# Patient Record
Sex: Female | Born: 1960 | Race: White | Hispanic: No | Marital: Single | Smoking: Never smoker
Health system: Southern US, Community
[De-identification: ages and names within clinical notes are randomized; demographics above are authoritative.]

## PROBLEM LIST (undated history)

## (undated) DIAGNOSIS — B029 Zoster without complications: Secondary | ICD-10-CM

## (undated) DIAGNOSIS — E079 Disorder of thyroid, unspecified: Secondary | ICD-10-CM

## (undated) DIAGNOSIS — I4891 Unspecified atrial fibrillation: Secondary | ICD-10-CM

## (undated) DIAGNOSIS — I517 Cardiomegaly: Secondary | ICD-10-CM

## (undated) DIAGNOSIS — Q8789 Other specified congenital malformation syndromes, not elsewhere classified: Secondary | ICD-10-CM

## (undated) HISTORY — PX: MINIMALLY INVASIVE TRICUSPID VALVE REPAIR: SHX5975

## (undated) HISTORY — PX: CORONARY ARTERY BYPASS GRAFT: SHX141

## (undated) HISTORY — PX: MITRAL VALVE REPAIR: SHX2039

## (undated) HISTORY — PX: MAZE: SHX5063

## (undated) HISTORY — PX: PACEMAKER INSERTION: SHX728

## (undated) HISTORY — PX: AORTIC VALVE REPLACEMENT: SHX41

---
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
## 2007-11-22 HISTORY — PX: ABDOMINAL HYSTERECTOMY: SHX81

## 2011-07-23 ENCOUNTER — Emergency Department (HOSPITAL_COMMUNITY): Payer: Medicaid Other

## 2011-07-23 ENCOUNTER — Inpatient Hospital Stay (HOSPITAL_COMMUNITY)
Admission: EM | Admit: 2011-07-23 | Discharge: 2011-07-29 | DRG: 310 | Disposition: A | Discharge status: Home / Self Care | Payer: Medicaid Other | Attending: Internal Medicine | Admitting: Internal Medicine

## 2011-07-23 DIAGNOSIS — F411 Generalized anxiety disorder: Secondary | ICD-10-CM | POA: Diagnosis present

## 2011-07-23 DIAGNOSIS — R040 Epistaxis: Secondary | ICD-10-CM | POA: Diagnosis not present

## 2011-07-23 DIAGNOSIS — I4891 Unspecified atrial fibrillation: Principal | ICD-10-CM | POA: Diagnosis present

## 2011-07-23 DIAGNOSIS — I059 Rheumatic mitral valve disease, unspecified: Secondary | ICD-10-CM | POA: Diagnosis present

## 2011-07-23 DIAGNOSIS — IMO0002 Reserved for concepts with insufficient information to code with codable children: Secondary | ICD-10-CM | POA: Diagnosis present

## 2011-07-23 DIAGNOSIS — M75100 Unspecified rotator cuff tear or rupture of unspecified shoulder, not specified as traumatic: Secondary | ICD-10-CM | POA: Diagnosis present

## 2011-07-23 DIAGNOSIS — I079 Rheumatic tricuspid valve disease, unspecified: Secondary | ICD-10-CM | POA: Diagnosis present

## 2011-07-23 DIAGNOSIS — Z79899 Other long term (current) drug therapy: Secondary | ICD-10-CM

## 2011-07-23 DIAGNOSIS — E876 Hypokalemia: Secondary | ICD-10-CM | POA: Diagnosis present

## 2011-07-23 DIAGNOSIS — R791 Abnormal coagulation profile: Secondary | ICD-10-CM | POA: Diagnosis present

## 2011-07-23 DIAGNOSIS — R0609 Other forms of dyspnea: Secondary | ICD-10-CM | POA: Diagnosis not present

## 2011-07-23 DIAGNOSIS — R0989 Other specified symptoms and signs involving the circulatory and respiratory systems: Secondary | ICD-10-CM | POA: Diagnosis not present

## 2011-07-23 DIAGNOSIS — G4733 Obstructive sleep apnea (adult) (pediatric): Secondary | ICD-10-CM | POA: Diagnosis present

## 2011-07-23 DIAGNOSIS — Z79890 Hormone replacement therapy (postmenopausal): Secondary | ICD-10-CM

## 2011-07-23 DIAGNOSIS — Z23 Encounter for immunization: Secondary | ICD-10-CM

## 2011-07-23 DIAGNOSIS — Z9079 Acquired absence of other genital organ(s): Secondary | ICD-10-CM

## 2011-07-23 LAB — URINALYSIS, ROUTINE W REFLEX MICROSCOPIC
Glucose, UA: NEGATIVE mg/dL
Hgb urine dipstick: NEGATIVE
Nitrite: NEGATIVE
Protein, ur: 30 mg/dL — AB
Specific Gravity, Urine: 1.034 — ABNORMAL HIGH (ref 1.005–1.030)
Urobilinogen, UA: 1 mg/dL (ref 0.0–1.0)
pH: 5.5 (ref 5.0–8.0)

## 2011-07-23 LAB — URINE MICROSCOPIC-ADD ON

## 2011-07-23 LAB — CBC
HCT: 39.1 % (ref 36.0–46.0)
Hemoglobin: 12.9 g/dL (ref 12.0–15.0)
MCH: 30.6 pg (ref 26.0–34.0)
MCHC: 33 g/dL (ref 30.0–36.0)
MCV: 92.7 fL (ref 78.0–100.0)
Platelets: 276 K/uL (ref 150–400)
RBC: 4.22 MIL/uL (ref 3.87–5.11)
RDW: 13.9 % (ref 11.5–15.5)
WBC: 7.4 K/uL (ref 4.0–10.5)

## 2011-07-23 LAB — BASIC METABOLIC PANEL WITH GFR
CO2: 22 meq/L (ref 19–32)
Chloride: 104 meq/L (ref 96–112)
Creatinine, Ser: 1.07 mg/dL (ref 0.50–1.10)
GFR calc Af Amer: >60 mL/min (ref >60)
Potassium: 3.2 meq/L — ABNORMAL LOW (ref 3.5–5.1)

## 2011-07-23 LAB — DIFFERENTIAL
Basophils Absolute: 0 K/uL (ref 0.0–0.1)
Basophils Relative: 0 % (ref 0–1)
Eosinophils Absolute: 0 K/uL (ref 0.0–0.7)
Eosinophils Relative: 1 % (ref 0–5)
Lymphocytes Relative: 29 % (ref 12–46)
Lymphs Abs: 2.1 K/uL (ref 0.7–4.0)
Monocytes Absolute: 0.5 10*3/uL (ref 0.1–1.0)
Monocytes Relative: 6 % (ref 3–12)
Neutro Abs: 4.7 10*3/uL (ref 1.7–7.7)
Neutrophils Relative %: 64 % (ref 43–77)

## 2011-07-23 LAB — POCT I-STAT, CHEM 8
BUN: 14 mg/dL (ref 6–23)
Calcium, Ion: 1.15 mmol/L (ref 1.12–1.32)
Chloride: 108 mEq/L (ref 96–112)
Creatinine, Ser: 1 mg/dL (ref 0.50–1.10)
Glucose, Bld: 123 mg/dL — ABNORMAL HIGH (ref 70–99)
HCT: 41 % (ref 36.0–46.0)
Hemoglobin: 13.9 g/dL (ref 12.0–15.0)
Potassium: 3.3 meq/L — ABNORMAL LOW (ref 3.5–5.1)
Sodium: 141 meq/L (ref 135–145)
TCO2: 19 mmol/L (ref 0–100)

## 2011-07-23 LAB — BASIC METABOLIC PANEL
BUN: 14 mg/dL (ref 6–23)
Calcium: 9.1 mg/dL (ref 8.4–10.5)
GFR calc non Af Amer: 55 mL/min — ABNORMAL LOW (ref >60)
Glucose, Bld: 122 mg/dL — ABNORMAL HIGH (ref 70–99)
Sodium: 139 mEq/L (ref 135–145)

## 2011-07-23 LAB — PROTIME-INR
INR: 0.99 (ref 0.00–1.49)
Prothrombin Time: 13.3 s (ref 11.6–15.2)

## 2011-07-23 LAB — APTT: aPTT: 27 s (ref 24–37)

## 2011-07-23 LAB — POCT I-STAT TROPONIN I: Troponin i, poc: 0 ng/mL (ref 0.00–0.08)

## 2011-07-23 LAB — D-DIMER, QUANTITATIVE: D-Dimer, Quant: 0.55 ug{FEU}/mL — ABNORMAL HIGH (ref 0.00–0.48)

## 2011-07-24 LAB — CARDIAC PANEL(CRET KIN+CKTOT+MB+TROPI)
CK, MB: 1.9 ng/mL (ref 0.3–4.0)
CK, MB: 1.7 ng/mL (ref 0.3–4.0)
Relative Index: INVALID (ref 0.0–2.5)
Total CK: 93 U/L (ref 7–177)
Total CK: 72 U/L (ref 7–177)
Troponin I: <0.3 ng/mL (ref <0.30)

## 2011-07-24 LAB — COMPREHENSIVE METABOLIC PANEL
ALT: 43 U/L — ABNORMAL HIGH (ref 0–35)
CO2: 24 mEq/L (ref 19–32)
Calcium: 8.2 mg/dL — ABNORMAL LOW (ref 8.4–10.5)
GFR calc Af Amer: >60 mL/min (ref >60)
GFR calc non Af Amer: >60 mL/min (ref >60)
Glucose, Bld: 100 mg/dL — ABNORMAL HIGH (ref 70–99)
Sodium: 138 mEq/L (ref 135–145)

## 2011-07-24 LAB — CBC
MCH: 30.9 pg (ref 26.0–34.0)
MCHC: 33.2 g/dL (ref 30.0–36.0)
Platelets: 200 10*3/uL (ref 150–400)

## 2011-07-24 LAB — PHOSPHORUS: Phosphorus: 2.4 mg/dL (ref 2.3–4.6)

## 2011-07-25 ENCOUNTER — Inpatient Hospital Stay (HOSPITAL_COMMUNITY): Payer: Medicaid Other

## 2011-07-25 LAB — BASIC METABOLIC PANEL
BUN: 11 mg/dL (ref 6–23)
Creatinine, Ser: 0.77 mg/dL (ref 0.50–1.10)
GFR calc Af Amer: >60 mL/min (ref >60)
GFR calc non Af Amer: >60 mL/min (ref >60)
Potassium: 4.1 mEq/L (ref 3.5–5.1)

## 2011-07-25 LAB — DIFFERENTIAL
Basophils Absolute: 0 10*3/uL (ref 0.0–0.1)
Eosinophils Absolute: 0.1 10*3/uL (ref 0.0–0.7)
Eosinophils Relative: 2 % (ref 0–5)
Lymphocytes Relative: 36 % (ref 12–46)
Monocytes Absolute: 0.3 10*3/uL (ref 0.1–1.0)

## 2011-07-25 LAB — CBC
HCT: 33.6 % — ABNORMAL LOW (ref 36.0–46.0)
MCHC: 32.1 g/dL (ref 30.0–36.0)
MCV: 93.6 fL (ref 78.0–100.0)
RDW: 13.9 % (ref 11.5–15.5)

## 2011-07-25 NOTE — H&P (Signed)
[name], Newton NO.:  [id number]  MEDICAL RECORD NO.:  [id number]  LOCATION:  WLED                         FACILITY:  [facility]  PHYSICIAN:  Marinda Elk, M.D.DATE OF BIRTH:  [birth year]  DATE OF ADMISSION:  07/23/2011 DATE OF DISCHARGE:                             HISTORY & PHYSICAL   CHIEF COMPLAINT:  Shoulder pain.  HISTORY OF PRESENT ILLNESS:  This is a 50 year old with past medical history of AFib and also Marfan-like features, and family history of Marfan syndrome that comes in for severe shoulder pain.  She relates that shoulder pain started intermittently about a month ago, but she just let a ride to see how it goes.  She relates one night before prior to admission, she started getting so bad.  She was not able to tolerate. She got up, took Motrin, and went back to bed, but then slept out throughout the night.  She went to work in the morning of admission and her pain continued to get worse.  She relates some mild shortness of breath with this shoulder pain, but she continued working to the point until 12 noon where she could not tolerate it, so she decided to go home.  She relates that the pain does not radiate.  Ice makes it better. Pressure makes it better.  Worse with breathing, but nothing makes __________.  She is up-to-date on her mammograms and Pap smears.  She does not relate any fever, chills, any nausea or vomiting.  Has not traveled recently.  No recent surgeries.  Up-to-date on her mammograms and Pap smears.  ALLERGIES:  ASPIRIN.  PAST MEDICAL HISTORY:  AFib.  MEDICATIONS: 1. Stadol 2 mg p.o. daily. 2. Caltrate D 1 tablet daily. 3. Allegra p.r.n. as needed. 4. Motrin 800 mg p.o. t.i.d.  SOCIAL HISTORY:  She denies alcohol, tobacco, or drugs.  FAMILY HISTORY:  Her father died of MI at 97.  Her mother is alive, is 43 years old, has diabetes, coronary artery disease, is a cancer survivor.  She has a full sister that has  Marfan syndrome.  She is 45. She is alive.  She has an older half sister that has Marfan syndrome and others were alive.  REVIEW OF SYSTEMS:  Ten point review of systems done, pertinent positives per HPI.  PHYSICAL EXAMINATION:  VITAL SIGNS:  Temperature 97, pulse of 150 initially when she came into the emergency room and now has been ranging 120 to 130.  Blood pressure 136/90.  She was saturating at 100% on room air breathing 18 times per minute. GENERAL:  She is awake, alert and oriented x3, in mild distress. HEENT:  Dry mucous membrane.  No JVD.  No bruit.  No thyromegaly. Anicteric.  No pallor. CARDIOVASCULAR:  She has irregular rate and rhythm, tachycardic with a positive S1 and S2. LUNGS:  Good air movement.  Clear to auscultation. ABDOMEN:  Positive bowel sounds, nontender, nondistended. EXTREMITIES:  Positive pulses.  No clubbing, cyanosis, or edema.  Her right shoulder is not red, tender, or warm.  Has mild pain reproducible by palpation, but not significant. NEURO:  Nonfocal.  LABORATORY DATA ON ADMISSION:  PTT of 27,  PT of 13, INR of __________. D-dimer was 0.55.  Her sodium was 139, potassium 3.2, chloride 104, bicarbonate of 22, glucose of 122, BUN of 14, creatinine of 1.0, calcium 9.1.  Her white count of 7.4 with an ANC of 4.7, hemoglobin of 12.9 with MCV of 92, platelet count 276.  Her first set of point of cardiac care markers negative x1.  IMAGING DATA ON ADMISSION:  Chest x-ray shows upper limits of normal size without evidence of acute cardiopulmonary disease.  X-ray of the shoulder is unremarkable.  CT angio of the chest showed no evidence of PE.  EKG shows atrial fibrillation at 175 when it was done.  No ST- segment depression.  ASSESSMENT/PLAN: 1. Atrial fibrillation with rapid ventricular response in a patient     with known history of atrial fibrillation.  We will admit to the     step-down unit and titrate her diltiazem.  We will cycle her     cardiac  enzymes.  We will check UA, UDS, TSH, B12, and alcohol     level.  We will call Cardiology in the morning.  At this time, a CT     scan was done to rule out a pulmonary embolism due to her high D-     dimer, but it was negative.  At this time, we will follow up on her     electrolytes to replete her potassium.  Check a magnesium level and     replete as needed.  We will try to control heart rate to keep it     below 110. 2. Shoulder pain.  Imaging was done with no significant fracture or     dislocation.  At this time, we will get CMET to evaluate her for     hepatobiliary disease.  This radiates to the shoulder.  We will get     pain control. 3. Hypokalemia.  We will replete and check a magnesium.     Marinda Elk, M.D.     AF/MEDQ  D:  07/23/2011  T:  07/23/2011  Job:  [id number]  cc:   Primary Care Doctor  Electronically Signed by Marinda Elk M.D. on 07/25/2011 07:01:04 AM

## 2011-07-26 LAB — CBC
HCT: 36.8 % (ref 36.0–46.0)
Hemoglobin: 11.9 g/dL — ABNORMAL LOW (ref 12.0–15.0)
MCH: 29.9 pg (ref 26.0–34.0)
MCV: 92.5 fL (ref 78.0–100.0)
RBC: 3.98 MIL/uL (ref 3.87–5.11)

## 2011-07-26 LAB — BASIC METABOLIC PANEL
CO2: 25 mEq/L (ref 19–32)
Calcium: 8.8 mg/dL (ref 8.4–10.5)
Creatinine, Ser: 0.76 mg/dL (ref 0.50–1.10)
Glucose, Bld: 90 mg/dL (ref 70–99)

## 2011-07-26 LAB — DIFFERENTIAL
Lymphs Abs: 1.5 10*3/uL (ref 0.7–4.0)
Monocytes Relative: 6 % (ref 3–12)
Neutro Abs: 4.5 10*3/uL (ref 1.7–7.7)
Neutrophils Relative %: 69 % (ref 43–77)

## 2011-07-26 LAB — PROTIME-INR: INR: 0.97 (ref 0.00–1.49)

## 2011-07-27 ENCOUNTER — Ambulatory Visit (HOSPITAL_COMMUNITY): Payer: Medicaid Other

## 2011-07-27 ENCOUNTER — Inpatient Hospital Stay (HOSPITAL_COMMUNITY): Payer: Medicaid Other

## 2011-07-27 LAB — BASIC METABOLIC PANEL
BUN: 15 mg/dL (ref 6–23)
CO2: 25 mEq/L (ref 19–32)
Chloride: 104 mEq/L (ref 96–112)
Creatinine, Ser: 0.91 mg/dL (ref 0.50–1.10)
GFR calc Af Amer: >60 mL/min (ref >60)
Glucose, Bld: 89 mg/dL (ref 70–99)

## 2011-07-27 LAB — CBC
HCT: 35.2 % — ABNORMAL LOW (ref 36.0–46.0)
MCV: 93.1 fL (ref 78.0–100.0)
RDW: 14.1 % (ref 11.5–15.5)
WBC: 4.4 10*3/uL (ref 4.0–10.5)

## 2011-07-27 LAB — DIFFERENTIAL
Eosinophils Relative: 2 % (ref 0–5)
Lymphocytes Relative: 35 % (ref 12–46)
Lymphs Abs: 1.5 10*3/uL (ref 0.7–4.0)

## 2011-07-27 MED ORDER — TECHNETIUM TC 99M TETROFOSMIN IV KIT
10.0000 | PACK | Freq: Once | INTRAVENOUS | Status: AC | PRN
  Administered 2011-07-27: 10 via INTRAVENOUS

## 2011-07-27 MED ORDER — TECHNETIUM TC 99M TETROFOSMIN IV KIT
30.0000 | PACK | Freq: Once | INTRAVENOUS | Status: AC | PRN
  Administered 2011-07-27: 30 via INTRAVENOUS

## 2011-07-28 LAB — PROTIME-INR
INR: 1.6 — ABNORMAL HIGH (ref 0.00–1.49)
Prothrombin Time: 19.3 seconds — ABNORMAL HIGH (ref 11.6–15.2)

## 2011-07-28 LAB — BLOOD GAS, ARTERIAL
Bicarbonate: 23.1 mEq/L (ref 20.0–24.0)
pCO2 arterial: 32.3 mmHg — ABNORMAL LOW (ref 35.0–45.0)
pH, Arterial: 7.468 — ABNORMAL HIGH (ref 7.350–7.400)
pO2, Arterial: 75.3 mmHg — ABNORMAL LOW (ref 80.0–100.0)

## 2011-07-29 LAB — PROTIME-INR: Prothrombin Time: 27.7 seconds — ABNORMAL HIGH (ref 11.6–15.2)

## 2011-07-30 NOTE — Discharge Summary (Signed)
Brittany Newton, Brittany Newton NO.:  [id number]  MEDICAL RECORD NO.:  [id number]  LOCATION:  1223                         FACILITY:  [facility]  PHYSICIAN:  Andreas Blower, MD       DATE OF BIRTH:  12-06-1960  DATE OF ADMISSION:  07/23/2011 DATE OF DISCHARGE:  07/29/2011                              DISCHARGE SUMMARY   PRIMARY CARE PHYSICIAN:  The patient does not have one at this time, is in the process of contemplating Dr. Algie Coffer to be her primary care physician.  CARDIOLOGIST:  Ricki Rodriguez, MD  DISCHARGE DIAGNOSES: 1. Atrial fibrillation with rapid ventricular response, rate     controlled, started on anticoagulation. 2. Right subdeltoid bursitis. 3. Moderate mitral regurgitation and moderate tricuspid regurgitation. 4. Epistaxis from trauma, resolved. 5. Suspected obstructive sleep apnea, we will arrange for outpatient     sleep study. 6. History of anxiety.  DISCHARGE MEDICATIONS: 1. Amiodarone 200 mg p.o. twice daily for 1 week, then once daily     thereafter. 2. Diltiazem 240 mg ER p.o. daily. 3. Hydrocodone/APAP 5/325 one tablet every 6 hours as needed for pain. 4. Metoprolol 100 mg p.o. twice daily. 5. Prednisone 40 mg p.o. daily for 1 day, then 20 mg p.o. daily for 2     days, then 10 mg p.o. daily for 2 days, and 5 mg p.o. daily for 2     days, and discontinue. 6. Senna 2 tablets daily as needed for constipation. 7. Warfarin 5 mg p.o. q.p.m. 8. Cal-Citrate D 600 mg p.o. twice daily. 9. Estradiol 2 mg p.o. daily at bedtime. 10.Fexofenadine 118 mg daily at bedtime. 11.Melatonin 3 mg p.o. daily at bedtime as needed for sleep. 12.Multivitamin 1 tablet p.o. daily.  BRIEF ADMITTING HISTORY AND PHYSICAL:  Brittany Newton is a 50 year old Caucasian female with history of AFib and also Marfan like features with family history of Marfan syndrome who presents with severe right shoulder pain.  RADIOLOGY/IMAGING:  The patient had a CT angiogram, which shows  no evidence for acute pulmonary embolism.  Enlarged left heart.  The patient had complete abdominal ultrasound, which showed 0.5 cm structure along the gallbladder wall.  Findings are most compatible with gallbladder polyp.  1.2 cm echoic structure in the spleen nonspecific.  The patient had an MRI of right shoulder without contrast, which showed subdeltoid bursitis.  Deformity of the distal clavicle could predispose to impingement.  The patient had myocardial perfusion scan, which shows no reversible or fixed defects.  The patient had a 2D echocardiogram on July 24, 2011, which showed left ventricle cavity size was mildly dilated.  There was mild concentric hypertrophy.  Systolic function was mildly reduced, ejection fraction was 45-50%.  Possible mild hypokinesis of the anterior septal myocardium.  Aortic valve showed mild regurgitation.  Mitral valve showed calcified annulus with moderate myxomatous degeneration.  Mild late systolic bowing without prolapse.  Moderate regurgitation. Tricuspid showed moderate regurgitation.  Pulmonary artery systolic pressure was mildly increased with a PA peak pressures of 36 mmHg.  LABORATORY DATA:  CBC shows a white count of 4.8, hemoglobin 11.3, hematocrit 35.2, platelet count 230, INR 2.53.  Electrolytes normal with  a BUN of 15, creatinine 0.91.  Troponins negative x3.  Urine drug screen positive for opiates.  Blood alcohol level less than 11.  Vitamin B12 of 219.  MRSA by PCR negative.  TSH 2.002.  HOSPITAL COURSE BY PROBLEM: 1. AFib with RVR.  The patient was admitted to step-down and was     started on IV diltiazem.  The patient's heart rate was not well-     controlled with IV diltiazem.  As a result, Dr. Algie Coffer with     Cardiology was consulted.  The patient was started on metoprolol     and amiodarone with good heart rate controlled.  Stress test was     also performed given the echo cardiogram findings.  Stress     myocardial  perfusion scan was negative.  The patient was also     started on Coumadin given her risk factors.  The patient to have     her PT/INR checked on August 01, 2011, in Dr. Roseanne Kaufman office. 2. Right shoulder pain.  The patient had MRI, which showed right     subdeltoid bursitis.  Dr. Thomasena Edis from [facility] was     consulted.  Previously, the patient has had a bad reaction to the     steroid injection with flu-like symptoms.  As a result, the patient     was started on steroid taper and the patient was also given     exercises for physical therapy.  The patient to continue exercises,     continue the taper for 1 more week.  If the patient continues to     have symptoms, the patient to follow up with Dr. Thomasena Edis as an     outpatient. 3. Moderate MR/TR, stable at this time.  Further management as per     Cardiology as outpatient. 4. Epistaxis from trauma, resolved with holding pressure. 5. Desaturation while asleep.  The patient's O2 saturations decreased     while asleep.  Suspect the patient may have obstructive sleep     apnea.  Will arrange for outpatient sleep study.  DISPOSITION AND FOLLOWUP:  The patient to follow up with Dr. Algie Coffer on August 01, 2011, both for an office visit as well as for a PT/INR. The patient is to follow with Dr. Thomasena Edis with [facility] in 3 weeks if the right shoulder pain is not improving.  Time spent on discharge, talking to the patient and coordinating care was 35 minutes.     Andreas Blower, MD     SR/MEDQ  D:  07/29/2011  T:  07/29/2011  Job:  [id number]  Electronically Signed by Wardell Heath Oliva Montecalvo  on 07/30/2011 08:44:37 PM

## 2011-08-17 ENCOUNTER — Encounter (HOSPITAL_BASED_OUTPATIENT_CLINIC_OR_DEPARTMENT_OTHER): Payer: Medicaid Other

## 2011-08-24 NOTE — Consult Note (Signed)
NAMEMCKINNLEY, COTTIER NO.:  [id number]  MEDICAL RECORD NO.:  [id number]  LOCATION:  1223                         FACILITY:  [facility]  PHYSICIAN:  Erasmo Leventhal, M.D.DATE OF BIRTH:  07-16-1961  DATE OF CONSULTATION:  07/26/2011 DATE OF DISCHARGE:                                CONSULTATION   HISTORY OF PRESENT ILLNESS:  Dr. Thomasena Edis of [facility] requested for evaluation of Ms. Knippel's right shoulder pain.  She was having severe shoulder pain.  She came to the emergency room. She was found to have some AFib, so she was admitted for a workup of AFib and continued right shoulder pain.  She states she is a single mom.  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  She denies any injury over the last several months. She has noted over the last month or so she has had some slow progression of the anterior shoulder pain at very point specific.  It would bother her very intermittently, sometimes it feels like a piece of tissue is rolling around in the shoulder or severe sharp pain.  She denies any numbness or tingling.  She would just try to avoid activities that seem to aggravate it.  She is using occasional Motrin.  She states she picked her young son up just the day before her admission when she felt this real sharp stabbing type pain across the front part of her shoulder with a piece tissue rolling around.  She tried to go to work that day.  She tolerated to about noontime, then she eventually came to the ER with severe shoulder pain when she was found to have significant atrial fib.  She does have a significant history of AFib in the past and Marfan's syndrome.  ALLERGIES:  ASPIRIN.  SOCIAL HISTORY:  Single mom.  She denies alcohol, drugs, or tobacco.  PHYSICAL EXAM:  GENERAL:  Patient is a healthy-appearing well-developed female in ICU bed.  She appears to be eating very comfortably without any  obvious distress. EXTREMITIES:  She does not have any obvious splinting type motion of her right upper extremity.  She has got good sensation in the right upper extremities with good radial pulse, good motion of the wrist and elbow. She is able to have strong biceps, triceps strength.  She is able raise her arm over her head today without any discomfort.  She is very tender with palpation over the bicipital groove and palpating the biceps tendon where it courses into the anterior part of the acromion.  She has no AC joint discomfort.  She does not have any weakness of her rotator cuff structures.  She does have some slight circumferential abduction discomfort.  She has got good motion of the neck.  Review of x-rays, 3 views taken in the hospital system were unremarkable for any obvious fractures.  Review of the MRI shows she has bicipital tendonitis with subacromial bursitis.  Rotator cuff structures were intact.  IMPRESSION:  Symptomatic right shoulder bicipital tendonitis with subacromial bursitis.  DISPOSITION:  At this time, after viewing patient's findings and physical exam, I reviewed the  issues with the patient about her shoulder.  We discussed treatments with a cortisone injection but she states she has had cortisone injection in the past in her feet and they have made her real ill-appearing, feeling like she has got the flu.  She would like to avoid this.  She has had prednisone in the past for anti- inflammatory issues and she says she has tolerated that well.  She is currently having a workup for atrial fib.  She is going to have a [place] test done tomorrow.  She is n.p.o. at this point at midnight. We did discuss with Dr. Algie Coffer about her shoulder and the possibility of prednisone Dosepak, he feels that it would be okay from his standpoint, so we will go ahead and start that tomorrow after she has her procedure done, otherwise if we start it tonight, she may not  get very good sleep.  In the meantime, we will just use some Norco for discomfort.  She is going to use warm compresses as she finds cold compresses really aggravate her shoulder. After a course of quieting her shoulder down, we will try some physical therapy.  Patient had no other questions.  We will continue to follow her up while she is here in the hospital.     Jamelle Rushing, P.A.   ______________________________ Erasmo Leventhal, M.D.    RWK/MEDQ  D:  07/26/2011  T:  07/26/2011  Job:  [id number]  cc:   Erasmo Leventhal, M.D. Fax: [phone]  Electronically Signed by Arlyn Leak P.A. on 08/05/2011 12:03:19 PM Electronically Signed by Eugenia Mcalpine M.D. on 08/24/2011 11:37:14 AM

## 2011-09-08 ENCOUNTER — Ambulatory Visit (HOSPITAL_BASED_OUTPATIENT_CLINIC_OR_DEPARTMENT_OTHER): Payer: Medicaid Other | Attending: Internal Medicine

## 2011-09-08 DIAGNOSIS — G4710 Hypersomnia, unspecified: Secondary | ICD-10-CM | POA: Insufficient documentation

## 2011-09-08 DIAGNOSIS — Z79899 Other long term (current) drug therapy: Secondary | ICD-10-CM | POA: Insufficient documentation

## 2011-09-08 DIAGNOSIS — I4891 Unspecified atrial fibrillation: Secondary | ICD-10-CM | POA: Insufficient documentation

## 2011-09-08 DIAGNOSIS — G4730 Sleep apnea, unspecified: Secondary | ICD-10-CM | POA: Insufficient documentation

## 2011-09-17 DIAGNOSIS — R0989 Other specified symptoms and signs involving the circulatory and respiratory systems: Secondary | ICD-10-CM

## 2011-09-17 DIAGNOSIS — G4710 Hypersomnia, unspecified: Secondary | ICD-10-CM

## 2011-09-17 DIAGNOSIS — R0609 Other forms of dyspnea: Secondary | ICD-10-CM

## 2011-09-17 DIAGNOSIS — G4730 Sleep apnea, unspecified: Secondary | ICD-10-CM

## 2011-09-17 NOTE — Procedures (Signed)
NAMEBILAN, Brittany Newton                 ACCOUNT NO.:  [id number]  MEDICAL RECORD NO.:  [id number]          PATIENT TYPE:  OUT  LOCATION:  SLEEP CENTER                 FACILITY:  [facility]  PHYSICIAN:  Eilan Mcinerny D. Maple Hudson, MD, FCCP, FACPDATE OF BIRTH:  June 22, 1961  DATE OF STUDY:  09/08/2011                           NOCTURNAL POLYSOMNOGRAM  REFERRING PHYSICIAN:  Andreas Blower, MD  REFERRING PHYSICIAN:  Andreas Blower, MD.  INDICATION FOR STUDY:  Hypersomnia with sleep apnea.  EPWORTH SLEEPINESS SCORE:  Epworth sleepiness score; 2/24.  BMI 27.7, weight 210 pounds, height 73 inches, neck 14 inches.  MEDICATIONS:  Home medications charted and reviewed.  SLEEP ARCHITECTURE:  Total sleep time 340.5 minutes with sleep efficiency 88.1%.  Stage I was 8.5%, stage II 64.5%, stage III 7.8%, REM 19.2% of total sleep time.  Sleep latency 10 minutes.  REM latency 78.5 minutes.  Awake after sleep onset 36.5 minutes.  Arousal index 15.7.  BEDTIME MEDICATION:  Estradiol, Caltrate, warfarin, amiodarone, metoprolol, alprazolam.  RESPIRATORY DATA:  Apnea-hypopnea index (AHI) 1.8 per hour.  A total of 10 events was scored, all as hypopneas and mostly associated with supine sleep and REM.  REM AHI 2.7 per hour.  There were insufficient numbers of events to qualify for CPAP titration protocol on the study night.  OXYGEN DATA:  Moderate snoring with oxygen desaturation to a nadir of 89% and a mean oxygen saturation through the study of 93% on room air.  CARDIAC DATA:  Atrial fibrillation with ventricular response, rate 68-76 per minute through most of the study.  MOVEMENT-PARASOMNIA:  No significant movement disturbance.  No bathroom trips.  IMPRESSIONS-RECOMMENDATIONS: 1. Occasional respiratory event with sleep disturbance, within normal     limits.  AHI 1.8 per hour (the normal range for adults is from 0-5     events per hour).  Moderate snoring with oxygen desaturation to a     nadir of 89% and a mean  oxygen saturation through the study of 93%     on room air.  No time was recorded with saturation less than     88% on room air. 2. Atrial fibrillation with controlled ventricular response rate.     Slayde Brault D. Maple Hudson, MD, [facility], FACP Diplomate, Biomedical engineer of Sleep Medicine Electronically Signed    CDY/MEDQ  D:  09/17/2011 09:34:59  T:  09/17/2011 09:58:38  Job:  [id number]

## 2012-05-16 ENCOUNTER — Inpatient Hospital Stay (HOSPITAL_COMMUNITY)
Admission: AD | Admit: 2012-05-16 | Discharge: 2012-05-21 | DRG: 202 | Disposition: A | Discharge status: Home / Self Care | Payer: Medicaid Other | Source: Ambulatory Visit | Attending: Cardiovascular Disease | Admitting: Cardiovascular Disease

## 2012-05-16 ENCOUNTER — Encounter (HOSPITAL_COMMUNITY): Payer: Self-pay | Admitting: *Deleted

## 2012-05-16 DIAGNOSIS — Z6829 Body mass index (BMI) 29.0-29.9, adult: Secondary | ICD-10-CM

## 2012-05-16 DIAGNOSIS — Q8740 Marfan's syndrome, unspecified: Secondary | ICD-10-CM

## 2012-05-16 DIAGNOSIS — Z7901 Long term (current) use of anticoagulants: Secondary | ICD-10-CM

## 2012-05-16 DIAGNOSIS — I4891 Unspecified atrial fibrillation: Secondary | ICD-10-CM | POA: Diagnosis present

## 2012-05-16 DIAGNOSIS — J40 Bronchitis, not specified as acute or chronic: Principal | ICD-10-CM | POA: Diagnosis present

## 2012-05-16 HISTORY — DX: Cardiomegaly: I51.7

## 2012-05-16 LAB — CBC
HCT: 37.6 % (ref 36.0–46.0)
MCH: 31.7 pg (ref 26.0–34.0)
MCHC: 33.2 g/dL (ref 30.0–36.0)
MCV: 95.4 fL (ref 78.0–100.0)
RDW: 14.2 % (ref 11.5–15.5)

## 2012-05-16 LAB — DIFFERENTIAL
Basophils Absolute: 0 10*3/uL (ref 0.0–0.1)
Basophils Relative: 0 % (ref 0–1)
Eosinophils Relative: 1 % (ref 0–5)
Monocytes Absolute: 0.7 10*3/uL (ref 0.1–1.0)

## 2012-05-16 LAB — BASIC METABOLIC PANEL
BUN: 18 mg/dL (ref 6–23)
CO2: 26 mEq/L (ref 19–32)
Chloride: 100 mEq/L (ref 96–112)
Creatinine, Ser: 1.15 mg/dL — ABNORMAL HIGH (ref 0.50–1.10)

## 2012-05-16 LAB — PROTIME-INR: Prothrombin Time: 20.5 seconds — ABNORMAL HIGH (ref 11.6–15.2)

## 2012-05-16 MED ORDER — FUROSEMIDE 40 MG PO TABS
40.0000 mg | ORAL_TABLET | Freq: Every day | ORAL | Status: DC
  Administered 2012-05-17 – 2012-05-21 (×5): 40 mg via ORAL
  Filled 2012-05-16 (×6): qty 1

## 2012-05-16 MED ORDER — PREDNISONE 20 MG PO TABS
20.0000 mg | ORAL_TABLET | Freq: Every day | ORAL | Status: DC
  Administered 2012-05-16 – 2012-05-21 (×6): 20 mg via ORAL
  Filled 2012-05-16 (×7): qty 1

## 2012-05-16 MED ORDER — SODIUM CHLORIDE 0.9 % IJ SOLN
3.0000 mL | Freq: Two times a day (BID) | INTRAMUSCULAR | Status: DC
  Administered 2012-05-16 – 2012-05-21 (×10): 3 mL via INTRAVENOUS

## 2012-05-16 MED ORDER — AMIODARONE HCL 200 MG PO TABS
200.0000 mg | ORAL_TABLET | Freq: Two times a day (BID) | ORAL | Status: DC
  Administered 2012-05-16 – 2012-05-21 (×9): 200 mg via ORAL
  Filled 2012-05-16 (×14): qty 1

## 2012-05-16 MED ORDER — HYDROCODONE-ACETAMINOPHEN 5-325 MG PO TABS
1.0000 | ORAL_TABLET | ORAL | Status: DC | PRN
  Administered 2012-05-18: 2 via ORAL
  Administered 2012-05-18 – 2012-05-21 (×3): 1 via ORAL
  Filled 2012-05-16 (×2): qty 1
  Filled 2012-05-16: qty 2
  Filled 2012-05-16: qty 1

## 2012-05-16 MED ORDER — POTASSIUM CHLORIDE CRYS ER 10 MEQ PO TBCR
10.0000 meq | EXTENDED_RELEASE_TABLET | Freq: Every day | ORAL | Status: DC
  Administered 2012-05-16 – 2012-05-21 (×6): 10 meq via ORAL
  Filled 2012-05-16 (×8): qty 1

## 2012-05-16 MED ORDER — LORATADINE 10 MG PO TABS
10.0000 mg | ORAL_TABLET | Freq: Every day | ORAL | Status: DC
  Administered 2012-05-17 – 2012-05-21 (×5): 10 mg via ORAL
  Filled 2012-05-16 (×7): qty 1

## 2012-05-16 MED ORDER — DEXTROSE 5 % IV SOLN
500.0000 mg | INTRAVENOUS | Status: DC
  Administered 2012-05-16 – 2012-05-18 (×3): 500 mg via INTRAVENOUS
  Filled 2012-05-16 (×3): qty 500

## 2012-05-16 MED ORDER — WARFARIN - PHYSICIAN DOSING INPATIENT: Freq: Every day | Status: DC

## 2012-05-16 MED ORDER — ALPRAZOLAM 0.25 MG PO TABS
0.2500 mg | ORAL_TABLET | Freq: Two times a day (BID) | ORAL | Status: DC | PRN
  Administered 2012-05-17 – 2012-05-20 (×3): 0.25 mg via ORAL
  Filled 2012-05-16 (×3): qty 1

## 2012-05-16 MED ORDER — DEXTROSE 5 % IV SOLN
1.0000 g | INTRAVENOUS | Status: DC
  Administered 2012-05-16 – 2012-05-20 (×5): 1 g via INTRAVENOUS
  Filled 2012-05-16 (×7): qty 10

## 2012-05-16 MED ORDER — LEVALBUTEROL TARTRATE 45 MCG/ACT IN AERO
2.0000 | INHALATION_SPRAY | RESPIRATORY_TRACT | Status: DC
  Administered 2012-05-16 – 2012-05-17 (×3): 2 via RESPIRATORY_TRACT
  Filled 2012-05-16: qty 15

## 2012-05-16 MED ORDER — ESTRADIOL 2 MG PO TABS
2.0000 mg | ORAL_TABLET | Freq: Every day | ORAL | Status: DC
  Administered 2012-05-16 – 2012-05-20 (×5): 2 mg via ORAL
  Filled 2012-05-16 (×7): qty 1

## 2012-05-16 MED ORDER — GUAIFENESIN 100 MG/5ML PO SOLN
5.0000 mL | ORAL | Status: DC | PRN
  Filled 2012-05-16: qty 5

## 2012-05-16 MED ORDER — WARFARIN SODIUM 3 MG PO TABS
3.0000 mg | ORAL_TABLET | ORAL | Status: DC
  Administered 2012-05-16: 3 mg via ORAL
  Filled 2012-05-16: qty 1

## 2012-05-16 MED ORDER — METOPROLOL TARTRATE 100 MG PO TABS
100.0000 mg | ORAL_TABLET | Freq: Two times a day (BID) | ORAL | Status: DC
  Administered 2012-05-16 – 2012-05-21 (×10): 100 mg via ORAL
  Filled 2012-05-16 (×13): qty 1

## 2012-05-16 NOTE — H&P (Signed)
Brittany Newton is an 51 y.o. female.   Chief Complaint: Cough and shortness of breath HPI: 51 years old white female with cough and cold x 3 weeks not improving with one round of antibiotic.  PMH: No DM< II No HTN, No smoking, A. Fibrillation, Marfan's syndrome.    No past surgical history on file.  Family history: Her father died of MI at 55. Her mother is alive, is 1 years old, has diabetes, coronary artery disease, is a cancer survivor. She has a full sister that has Marfan syndrome. She is 45. She is alive. She has an older half sister that has Marfan syndrome and others were alive.  Social History:  does not have a smoking history. Her alcohol and drug histories not on file.  Allergies: Aspirin  No prescriptions prior to admission  Amiodarone 200 mg, Warfarin 3 mg, Metoprolol 100 mg. Bid, xanax, estradiol 2 mg. Lasix, Allegra-D, KCl  No results found for this or any previous visit (from the past 48 hour(s)). No results found.  @ROS @ No weight gain, + chest pain, + asthma, No gi bleed, No blood transfusion, no hepatitis, No kidney stone, No CVA, seizures or psychiatric admissions  Physical Exam P-94, R-18, BP-114/77, T-98.2, O2 sat-97 % GENERAL: She is awake, alert and oriented x3, in mild respiratory distress.  HEENT: PERl, EOMI, Conj- Pink, Sclera-no icterus. Throat-red, no exudate.  Neck: No JVD. No bruit. No thyromegaly.  CARDIOVASCULAR: She has irregular rate and rhythm, with a  positive S1 and S2.  LUNGS: Good air movement. Expiratory wheeze to auscultation.  ABDOMEN: Positive bowel sounds, nontender, nondistended.  EXTREMITIES: Positive pulses. No clubbing, cyanosis, or edema.  NEURO: Nonfocal.   Assessment/Plan Bronchitis Chronic coumadin use A. Fibrillation  Home medications, Antibiotic, Prednisone  Brittany Newton S 05/16/2012, 12:57 PM

## 2012-05-16 NOTE — Progress Notes (Signed)
Pt being admitted with bronchitis from Dr. Roseanne Kaufman office. Had bronchitis for 3 weeks. 10 days of antibiotics and steroids still not better.

## 2012-05-17 MED ORDER — WARFARIN SODIUM 3 MG PO TABS
3.0000 mg | ORAL_TABLET | Freq: Once | ORAL | Status: AC
  Administered 2012-05-17: 3 mg via ORAL
  Filled 2012-05-17: qty 1

## 2012-05-17 MED ORDER — DIGOXIN 250 MCG PO TABS
0.2500 mg | ORAL_TABLET | Freq: Every day | ORAL | Status: AC
  Administered 2012-05-17 – 2012-05-20 (×4): 0.25 mg via ORAL
  Filled 2012-05-17 (×6): qty 1

## 2012-05-17 MED ORDER — WARFARIN SODIUM 3 MG PO TABS
3.0000 mg | ORAL_TABLET | Freq: Every day | ORAL | Status: DC
  Administered 2012-05-17 – 2012-05-20 (×4): 3 mg via ORAL
  Filled 2012-05-17 (×7): qty 1

## 2012-05-17 MED ORDER — LEVALBUTEROL TARTRATE 45 MCG/ACT IN AERO
2.0000 | INHALATION_SPRAY | Freq: Two times a day (BID) | RESPIRATORY_TRACT | Status: DC
  Administered 2012-05-17 – 2012-05-21 (×9): 2 via RESPIRATORY_TRACT

## 2012-05-17 NOTE — Progress Notes (Signed)
Subjective:  Still coughing. No fever.  Objective:  Vital Signs in the last 24 hours: Temp:  [97.7 F (36.5 C)-98.2 F (36.8 C)] 97.7 F (36.5 C) (06/27 0500) Pulse Rate:  [83-98] 83  (06/27 0500) Cardiac Rhythm:  [-] Atrial fibrillation (06/26 2028) Resp:  [18] 18  (06/27 0500) BP: (96-114)/(65-77) 96/67 mmHg (06/27 0500) SpO2:  [93 %-98 %] 98 % (06/27 0810) Weight:  [105.9 kg (233 lb 7.5 oz)] 105.9 kg (233 lb 7.5 oz) (06/26 1400)  Physical Exam: BP Readings from Last 1 Encounters:  05/17/12 96/67    Wt Readings from Last 1 Encounters:  05/16/12 105.9 kg (233 lb 7.5 oz)    Weight change:   HEENT: [place]/AT, Eyes-Brown, PERL, EOMI, Conjunctiva-Pink, Sclera-Non-icteric Neck: No JVD, No bruit, Trachea midline. Lungs:  Harsh, Bilateral. Cardiac:  Regular rhythm, normal S1 and S2, no S3.  Abdomen:  Soft, non-tender. Extremities:  No edema present. No cyanosis. No clubbing. CNS: AxOx3, Cranial nerves grossly intact, moves all 4 extremities. Right handed. Skin: Warm and dry.   Intake/Output from previous day: 06/26 0701 - 06/27 0700 In: 3 [I.V.:3] Out: -     Lab Results: BMET    Component Value Date/Time   NA 136 05/16/2012 1315   K 3.7 05/16/2012 1315   CL 100 05/16/2012 1315   CO2 26 05/16/2012 1315   GLUCOSE 104* 05/16/2012 1315   BUN 18 05/16/2012 1315   CREATININE 1.15* 05/16/2012 1315   CALCIUM 8.8 05/16/2012 1315   GFRNONAA 55* 05/16/2012 1315   GFRAA 64* 05/16/2012 1315   CBC    Component Value Date/Time   WBC 11.1* 05/16/2012 1315   RBC 3.94 05/16/2012 1315   HGB 12.5 05/16/2012 1315   HCT 37.6 05/16/2012 1315   PLT 291 05/16/2012 1315   MCV 95.4 05/16/2012 1315   MCH 31.7 05/16/2012 1315   MCHC 33.2 05/16/2012 1315   RDW 14.2 05/16/2012 1315   LYMPHSABS 0.8 05/16/2012 1315   MONOABS 0.7 05/16/2012 1315   EOSABS 0.1 05/16/2012 1315   BASOSABS 0.0 05/16/2012 1315   CARDIAC ENZYMES Lab Results  Component Value Date   CKTOTAL 72 07/24/2011   CKMB 1.7 07/24/2011   TROPONINI <0.30 07/24/2011    Assessment/Plan:  Patient Active Hospital Problem List: Bronchitis Chronic coumadin use- sub-therapeutic INR. A. Fibrillation Marfan's syndrome  Continue antibiotic Increase coumadin Ice cubes/lozenges as needed   LOS: 1 day    Orpah Cobb  MD  05/17/2012, 8:38 AM

## 2012-05-18 ENCOUNTER — Inpatient Hospital Stay (HOSPITAL_COMMUNITY): Payer: Medicaid Other

## 2012-05-18 LAB — PROTIME-INR: INR: 1.78 — ABNORMAL HIGH (ref 0.00–1.49)

## 2012-05-18 MED ORDER — AZITHROMYCIN 250 MG PO TABS
250.0000 mg | ORAL_TABLET | Freq: Once | ORAL | Status: AC
  Administered 2012-05-18: 250 mg via ORAL
  Filled 2012-05-18: qty 1

## 2012-05-18 MED ORDER — AZITHROMYCIN 500 MG PO TABS
500.0000 mg | ORAL_TABLET | Freq: Every day | ORAL | Status: DC
Start: 2012-05-19 — End: 2012-05-21
  Administered 2012-05-19 – 2012-05-21 (×3): 500 mg via ORAL
  Filled 2012-05-18 (×4): qty 1

## 2012-05-18 NOTE — Progress Notes (Signed)
Subjective:  Afebrile. + Cough. X-ray suggestive of resolving pneumonia or atelectasis.  Objective:  Vital Signs in the last 24 hours: Temp:  [97.5 F (36.4 C)-98 F (36.7 C)] 98 F (36.7 C) (06/28 1400) Pulse Rate:  [74-107] 87  (06/28 1400) Cardiac Rhythm:  [-] Atrial flutter (06/27 2157) Resp:  [20] 20  (06/28 1400) BP: (98-108)/(69-71) 98/70 mmHg (06/28 1400) SpO2:  [96 %-99 %] 97 % (06/28 1400)  Physical Exam: BP Readings from Last 1 Encounters:  05/18/12 98/70    Wt Readings from Last 1 Encounters:  05/16/12 105.9 kg (233 lb 7.5 oz)    Weight change:   HEENT: [place]/AT, Eyes-Blue, PERL, EOMI, Conjunctiva-Pink, Sclera-Non-icteric Neck: No JVD, No bruit, Trachea midline. Lungs:  Forced expiratory wheeze, Bilateral. Cardiac:  Regular rhythm, normal S1 and S2, no S3.  Abdomen:  Soft, non-tender. Extremities:  No edema present. No cyanosis. No clubbing. CNS: AxOx3, Cranial nerves grossly intact, moves all 4 extremities. Right handed. Skin: Warm and dry.   Intake/Output from previous day: 06/27 0701 - 06/28 0700 In: 123 [P.O.:120; I.V.:3] Out: -     Lab Results: BMET    Component Value Date/Time   NA 136 05/16/2012 1315   K 3.7 05/16/2012 1315   CL 100 05/16/2012 1315   CO2 26 05/16/2012 1315   GLUCOSE 104* 05/16/2012 1315   BUN 18 05/16/2012 1315   CREATININE 1.15* 05/16/2012 1315   CALCIUM 8.8 05/16/2012 1315   GFRNONAA 55* 05/16/2012 1315   GFRAA 64* 05/16/2012 1315   CBC    Component Value Date/Time   WBC 11.1* 05/16/2012 1315   RBC 3.94 05/16/2012 1315   HGB 12.5 05/16/2012 1315   HCT 37.6 05/16/2012 1315   PLT 291 05/16/2012 1315   MCV 95.4 05/16/2012 1315   MCH 31.7 05/16/2012 1315   MCHC 33.2 05/16/2012 1315   RDW 14.2 05/16/2012 1315   LYMPHSABS 0.8 05/16/2012 1315   MONOABS 0.7 05/16/2012 1315   EOSABS 0.1 05/16/2012 1315   BASOSABS 0.0 05/16/2012 1315   CARDIAC ENZYMES Lab Results  Component Value Date   CKTOTAL 72 07/24/2011   CKMB 1.7 07/24/2011   TROPONINI  <0.30 07/24/2011    Assessment/Plan:  Patient Active Hospital Problem List:  Bronchitis  Chronic coumadin use- sub-therapeutic INR.  A. Fibrillation  Marfan's syndrome  CXR/Continue medications.    LOS: 2 days    Orpah Cobb  MD  05/18/2012, 6:32 PM

## 2012-05-19 LAB — PROTIME-INR
INR: 2.21 — ABNORMAL HIGH (ref 0.00–1.49)
Prothrombin Time: 24.9 seconds — ABNORMAL HIGH (ref 11.6–15.2)

## 2012-05-19 MED ORDER — GUAIFENESIN ER 600 MG PO TB12
600.0000 mg | ORAL_TABLET | Freq: Two times a day (BID) | ORAL | Status: DC | PRN
  Filled 2012-05-19: qty 1

## 2012-05-19 NOTE — Progress Notes (Signed)
Subjective:  Patient complains of cough with mild shortness of breath. Denies chest pain or hemoptysis  Objective:  Vital Signs in the last 24 hours: Temp:  [97.4 F (36.3 C)-98 F (36.7 C)] 97.9 F (36.6 C) (06/29 0500) Pulse Rate:  [78-91] 81  (06/29 1146) Resp:  [18-20] 18  (06/29 0500) BP: (92-98)/(56-70) 92/56 mmHg (06/29 1009) SpO2:  [97 %] 97 % (06/29 0500) Weight:  [106.7 kg (235 lb 3.7 oz)] 106.7 kg (235 lb 3.7 oz) (06/29 0500)  Intake/Output from previous day:   Intake/Output from this shift:    Physical Exam: Neck: no adenopathy, no carotid bruit, no JVD and supple, symmetrical, trachea midline Lungs: Decreased breath sound at bases with left basilar rhonchi Heart: irregularly irregular rhythm, S1, S2 normal and Soft systolic murmur noted Abdomen: soft, non-tender; bowel sounds normal; no masses,  no organomegaly Extremities: extremities normal, atraumatic, no cyanosis or edema  Lab Results:  Basename 05/16/12 1315  WBC 11.1*  HGB 12.5  PLT 291    Basename 05/16/12 1315  NA 136  K 3.7  CL 100  CO2 26  GLUCOSE 104*  BUN 18  CREATININE 1.15*   No results found for this basename: TROPONINI:2,CK,MB:2 in the last 72 hours Hepatic Function Panel No results found for this basename: PROT,ALBUMIN,AST,ALT,ALKPHOS,BILITOT,BILIDIR,IBILI in the last 72 hours No results found for this basename: CHOL in the last 72 hours No results found for this basename: PROTIME in the last 72 hours  Imaging: Imaging results have been reviewed and Dg Chest 2 View  05/18/2012  *RADIOLOGY REPORT*  Clinical Data: Cough and congestion.  Possible bronchitis.  CHEST - 2 VIEW  Comparison: Chest x-ray 07/23/2011.  Findings: There is a linear opacity in the region of the left lower lobe, favored to represent an area of subsegmental atelectasis. Mild blunting of the left costophrenic sulcus suggests a small left- sided pleural effusion.  Lungs are otherwise clear.  Pulmonary vasculature is  normal.  Heart size is mildly enlarged (unchanged). Mediastinal contours are unremarkable.  IMPRESSION: 1.  Linear opacity in the left lower lobe is favored to represent an area of subsegmental atelectasis.  Given the patient's history of recent infectious symptoms, this could conceivably represent an area of resolving pneumonia.  There is also a small left-sided pleural effusion.  Repeat radiographs in 2-3 weeks after possible trial of antimicrobial therapy are recommended to ensure complete resolution of this finding (i.e., to exclude a centrally obstructing lesion).  Original Report Authenticated By: Florencia Reasons, M.D.    Cardiac Studies:  Assessment/Plan:  Resolving left lower lobe pneumonia Atrial fibrillation Hypertension Morbid obesity History of Marfan syndrome Plan Continue present management   LOS: 3 days    Valory Wetherby N 05/19/2012, 12:58 PM

## 2012-05-20 LAB — PROTIME-INR
INR: 2.29 — ABNORMAL HIGH (ref 0.00–1.49)
Prothrombin Time: 25.6 seconds — ABNORMAL HIGH (ref 11.6–15.2)

## 2012-05-20 NOTE — Progress Notes (Signed)
Subjective:  States feels better than yesterday cough has improved  Objective:  Vital Signs in the last 24 hours: Temp:  [97.5 F (36.4 C)-98 F (36.7 C)] 97.5 F (36.4 C) (06/30 0500) Pulse Rate:  [75-91] 86  (06/30 1024) Resp:  [18-20] 18  (06/30 0500) BP: (100-128)/(65-79) 128/76 mmHg (06/30 1025) SpO2:  [95 %-97 %] 96 % (06/30 0840) Weight:  [106.4 kg (234 lb 9.1 oz)] 106.4 kg (234 lb 9.1 oz) (06/30 0500)  Intake/Output from previous day:   Intake/Output from this shift:    Physical Exam: Neck: no adenopathy, no carotid bruit, no JVD and supple, symmetrical, trachea midline Lungs: Decreased breath sound at bases with occasional rhonchi at left base Heart: irregularly irregular rhythm, S1, S2 normal and Soft systolic murmur Abdomen: soft, non-tender; bowel sounds normal; no masses,  no organomegaly Extremities: extremities normal, atraumatic, no cyanosis or edema  Lab Results: No results found for this basename: WBC:2,HGB:2,PLT:2 in the last 72 hours No results found for this basename: NA:2,K:2,CL:2,CO2:2,GLUCOSE:2,BUN:2,CREATININE:2 in the last 72 hours No results found for this basename: TROPONINI:2,CK,MB:2 in the last 72 hours Hepatic Function Panel No results found for this basename: PROT,ALBUMIN,AST,ALT,ALKPHOS,BILITOT,BILIDIR,IBILI in the last 72 hours No results found for this basename: CHOL in the last 72 hours No results found for this basename: PROTIME in the last 72 hours  Imaging: Imaging results have been reviewed and Dg Chest 2 View  05/18/2012  *RADIOLOGY REPORT*  Clinical Data: Cough and congestion.  Possible bronchitis.  CHEST - 2 VIEW  Comparison: Chest x-ray 07/23/2011.  Findings: There is a linear opacity in the region of the left lower lobe, favored to represent an area of subsegmental atelectasis. Mild blunting of the left costophrenic sulcus suggests a small left- sided pleural effusion.  Lungs are otherwise clear.  Pulmonary vasculature is normal.   Heart size is mildly enlarged (unchanged). Mediastinal contours are unremarkable.  IMPRESSION: 1.  Linear opacity in the left lower lobe is favored to represent an area of subsegmental atelectasis.  Given the patient's history of recent infectious symptoms, this could conceivably represent an area of resolving pneumonia.  There is also a small left-sided pleural effusion.  Repeat radiographs in 2-3 weeks after possible trial of antimicrobial therapy are recommended to ensure complete resolution of this finding (i.e., to exclude a centrally obstructing lesion).  Original Report Authenticated By: Florencia Reasons, M.D.    Cardiac Studies:  Assessment/Plan:  Resolving left lower lobe pneumonia  Atrial fibrillation  Hypertension  Morbid obesity  History of Marfan syndrome  Plan  Continue present management Check chest x-ray in a.m.  LOS: 4 days    Brittany Newton N 05/20/2012, 11:38 AM

## 2012-05-21 ENCOUNTER — Inpatient Hospital Stay (HOSPITAL_COMMUNITY): Payer: Medicaid Other

## 2012-05-21 LAB — PROTIME-INR: Prothrombin Time: 27.1 seconds — ABNORMAL HIGH (ref 11.6–15.2)

## 2012-05-21 MED ORDER — WARFARIN SODIUM 3 MG PO TABS: 3.0000 mg | ORAL_TABLET | ORAL | Status: DC

## 2012-05-21 NOTE — Progress Notes (Signed)
Pt discharged to home with family. Received discharge instructions with no further questions. Will make follow up appointment with Dr Algie Coffer in a week and will continue prednisone taper at home. Stable for DC. IV DC'd. Duwaine Maxin, RN

## 2012-05-21 NOTE — Discharge Summary (Signed)
Physician Discharge Summary  Patient ID: Brittany Newton MRN: [id number] DOB/AGE: 23-Jan-1961 51 y.o.  Admit date: 05/16/2012 Discharge date: 05/21/2012  Admission Diagnoses: Bronchitis  Chronic coumadin use- sub-therapeutic INR.  A. Fibrillation  Marfan's syndrome  Discharge Diagnoses:  Principle Problems:  *Bronchitis* Chronic coumadin use.  A. Fibrillation  Marfan's syndrome Anxiety  Discharged Condition: good  Hospital Course: 51 years old white female with 3 weeks history of cough, shortness of breath in spite of antibiotics. She responded well to IV rocephin and oral zithromax. Her INR improved with increasing dose of coumadin and she was discharged home in satisfactory condition.  Consults: None  Significant Diagnostic Studies: labs: Normal electrolytes and CBC except slightly high WBC count of 11.1.  CXR- 05/18/2012: 1. Linear opacity in the left lower lobe is favored to represent an area of subsegmental atelectasis. Given the patient's history of recent infectious symptoms, this could conceivably represent an area of resolving pneumonia. There is also a small left-sided pleural effusion. Repeat radiographs in 2-3 weeks after possible trial of antimicrobial therapy are recommended to ensure complete resolution of this finding (i.e., to exclude a centrally obstructing lesion).  CXR-05/21/2012: Resolved left lower lobe airspace disease and pleural fluid. Cardiomegaly, without acute disease.  Treatments: antibiotics: ceftriaxone and azithromycin  Discharge Exam: Blood pressure 108/74, pulse 72, temperature 98.3 F (36.8 C), temperature source Oral, resp. rate 18, height 6\' 2"  (1.88 m), weight 104.2 kg (229 lb 11.5 oz), SpO2 97.00%. HEENT: [place]/AT, Eyes-Blue, PERL, EOMI, Conjunctiva-Pink, Sclera-Non-icteric  Neck: No JVD, No bruit, Trachea midline.  Lungs: Clear, Bilateral.  Cardiac: Regular rhythm, normal S1 and S2, no S3.  Abdomen: Soft, non-tender.  Extremities: No edema present.  No cyanosis. No clubbing.  CNS: AxOx3, Cranial nerves grossly intact, moves all 4 extremities. Right handed.  Skin: Warm and dry.   Disposition: 01-Home or Self Care   Medication List  As of 05/21/2012  9:48 AM   TAKE these medications         ALPRAZolam 0.25 MG tablet   Commonly known as: XANAX   Take 0.25 mg by mouth 2 (two) times daily as needed. For anxiety.      amiodarone 200 MG tablet   Commonly known as: PACERONE   Take 200 mg by mouth 2 (two) times daily.      CALTRATE 600+D PO   Take 600 mg by mouth 2 (two) times daily.      estradiol 2 MG tablet   Commonly known as: ESTRACE   Take 2 mg by mouth at bedtime.      fexofenadine-pseudoephedrine 180-240 MG per 24 hr tablet   Commonly known as: ALLEGRA-D 24   Take 1 tablet by mouth daily.      furosemide 40 MG tablet   Commonly known as: LASIX   Take 40 mg by mouth daily.      Glucosamine HCl 1500 MG Tabs   Take 1,500 mg by mouth 2 (two) times daily.      metoprolol 100 MG tablet   Commonly known as: LOPRESSOR   Take 100 mg by mouth 2 (two) times daily.      PHILLIPS [facility]ON [facility]ALTH Caps   Take 1 capsule by mouth at bedtime.      potassium chloride SA 20 MEQ tablet   Commonly known as: K-DUR,KLOR-CON   Take 10 mEq by mouth daily.      warfarin 3 MG tablet   Commonly known as: COUMADIN   Take 1 tablet (3 mg total) by  mouth every other day.           Follow-up Information    Follow up with [facility] S, MD. Schedule an appointment as soon as possible for a visit in 1 week.   Contact information:   [address] [place] [address] [phone]          Signed: Ricki Rodriguez 05/21/2012, 9:48 AM

## 2012-05-27 ENCOUNTER — Other Ambulatory Visit: Payer: Self-pay | Admitting: Cardiology

## 2012-05-29 ENCOUNTER — Ambulatory Visit
Admission: RE | Admit: 2012-05-29 | Discharge: 2012-05-29 | Disposition: A | Discharge status: Home / Self Care | Payer: Medicaid Other | Source: Ambulatory Visit | Attending: Cardiovascular Disease | Admitting: Cardiovascular Disease

## 2012-05-29 ENCOUNTER — Other Ambulatory Visit: Payer: Self-pay | Admitting: Cardiovascular Disease

## 2012-05-29 DIAGNOSIS — R200 Anesthesia of skin: Secondary | ICD-10-CM

## 2012-06-11 ENCOUNTER — Other Ambulatory Visit: Payer: Self-pay | Admitting: Orthopedic Surgery

## 2012-06-11 DIAGNOSIS — M545 Low back pain: Secondary | ICD-10-CM

## 2012-06-15 ENCOUNTER — Ambulatory Visit
Admission: RE | Admit: 2012-06-15 | Discharge: 2012-06-15 | Disposition: A | Discharge status: Home / Self Care | Payer: Medicaid Other | Source: Ambulatory Visit | Attending: Orthopedic Surgery | Admitting: Orthopedic Surgery

## 2012-06-15 DIAGNOSIS — M545 Low back pain: Secondary | ICD-10-CM

## 2012-06-21 ENCOUNTER — Ambulatory Visit: Payer: Medicaid Other | Attending: Orthopedic Surgery | Admitting: Rehabilitation

## 2012-06-21 DIAGNOSIS — M2550 Pain in unspecified joint: Secondary | ICD-10-CM | POA: Insufficient documentation

## 2012-06-21 DIAGNOSIS — IMO0001 Reserved for inherently not codable concepts without codable children: Secondary | ICD-10-CM | POA: Insufficient documentation

## 2012-06-21 DIAGNOSIS — R293 Abnormal posture: Secondary | ICD-10-CM | POA: Insufficient documentation

## 2012-06-21 DIAGNOSIS — M2560 Stiffness of unspecified joint, not elsewhere classified: Secondary | ICD-10-CM | POA: Insufficient documentation

## 2012-07-04 ENCOUNTER — Ambulatory Visit: Payer: Medicaid Other | Admitting: Physical Therapy

## 2012-07-12 ENCOUNTER — Ambulatory Visit: Payer: Medicaid Other | Admitting: Rehabilitation

## 2012-07-19 ENCOUNTER — Encounter: Payer: Medicaid Other | Admitting: Rehabilitation

## 2012-07-31 ENCOUNTER — Ambulatory Visit: Payer: Medicaid Other | Attending: Orthopedic Surgery | Admitting: Rehabilitation

## 2012-07-31 DIAGNOSIS — R293 Abnormal posture: Secondary | ICD-10-CM | POA: Insufficient documentation

## 2012-07-31 DIAGNOSIS — IMO0001 Reserved for inherently not codable concepts without codable children: Secondary | ICD-10-CM | POA: Insufficient documentation

## 2012-07-31 DIAGNOSIS — M2560 Stiffness of unspecified joint, not elsewhere classified: Secondary | ICD-10-CM | POA: Insufficient documentation

## 2012-07-31 DIAGNOSIS — M2550 Pain in unspecified joint: Secondary | ICD-10-CM | POA: Insufficient documentation

## 2013-06-14 ENCOUNTER — Other Ambulatory Visit: Payer: Self-pay

## 2013-06-14 LAB — BASIC METABOLIC PANEL
Calcium, Total: 9.1 mg/dL (ref 8.5–10.1)
Chloride: 105 mmol/L (ref 98–107)
Co2: 29 mmol/L (ref 21–32)
Creatinine: 1.22 mg/dL (ref 0.60–1.30)
EGFR (African American): 60 — ABNORMAL LOW
EGFR (Non-African Amer.): 52 — ABNORMAL LOW
Glucose: 92 mg/dL (ref 65–99)
Potassium: 4.2 mmol/L (ref 3.5–5.1)
Sodium: 138 mmol/L (ref 136–145)

## 2013-06-14 LAB — HEPATIC FUNCTION PANEL A (ARMC)
Alkaline Phosphatase: 123 U/L (ref 50–136)
Bilirubin, Direct: 0.1 mg/dL (ref 0.00–0.20)
SGOT(AST): 22 U/L (ref 15–37)
SGPT (ALT): 27 U/L (ref 12–78)

## 2013-06-14 LAB — PROTIME-INR
INR: 1.8
Prothrombin Time: 20.9 secs — ABNORMAL HIGH (ref 11.5–14.7)

## 2013-06-14 LAB — CBC WITH DIFFERENTIAL/PLATELET
Basophil #: 0 10*3/uL (ref 0.0–0.1)
Eosinophil #: 0.1 10*3/uL (ref 0.0–0.7)
Eosinophil %: 1.5 %
HCT: 41.3 % (ref 35.0–47.0)
HGB: 13.6 g/dL (ref 12.0–16.0)
Lymphocyte #: 0.9 10*3/uL — ABNORMAL LOW (ref 1.0–3.6)
Lymphocyte %: 24.9 %
MCH: 30.8 pg (ref 26.0–34.0)
MCV: 93 fL (ref 80–100)
Monocyte #: 0.3 x10 3/mm (ref 0.2–0.9)
Neutrophil #: 2.4 10*3/uL (ref 1.4–6.5)
Neutrophil %: 64.5 %
Platelet: 242 10*3/uL (ref 150–440)
RBC: 4.43 10*6/uL (ref 3.80–5.20)
RDW: 14.2 % (ref 11.5–14.5)
WBC: 3.8 10*3/uL (ref 3.6–11.0)

## 2013-06-14 LAB — TSH: Thyroid Stimulating Horm: 1.49 u[IU]/mL

## 2013-06-14 LAB — LIPID PANEL: Ldl Cholesterol, Calc: 112 mg/dL — ABNORMAL HIGH (ref 0–100)

## 2013-07-31 IMAGING — CT CT ANGIO CHEST
1 of 2 series · 19 of 32 positions shown · IV contrast (APPLIED)
Comparison: Plain film 07/23/2011

CLINICAL DATA: Right upper chest pain, right shoulder pain.
Positive D-dimer.

CT ANGIOGRAPHY CHEST WITH CONTRAST
TECHNIQUE: Multidetector CT imaging of the chest was performed
using the standard protocol during bolus administration of
intravenous contrast.  Multiplanar CT image reconstructions
including MIPs were obtained to evaluate the vascular anatomy.
Contrast:  100 ml Omnipaque 300

[Series 11: thins for pacs · axial · 0.76mm/px · z∈[+1122,+1370]mm · 19 of 276 slices shown]
[im 14/276  lung]
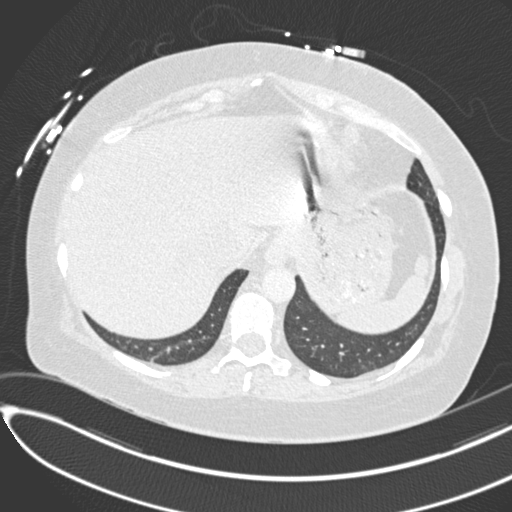
[im 28/276  mediastinal]
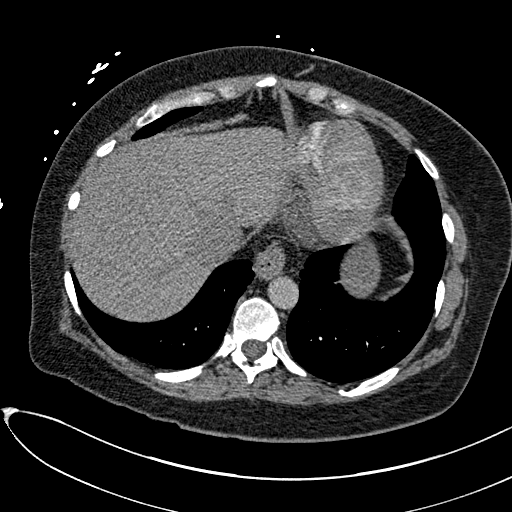
[im 42/276  lung]
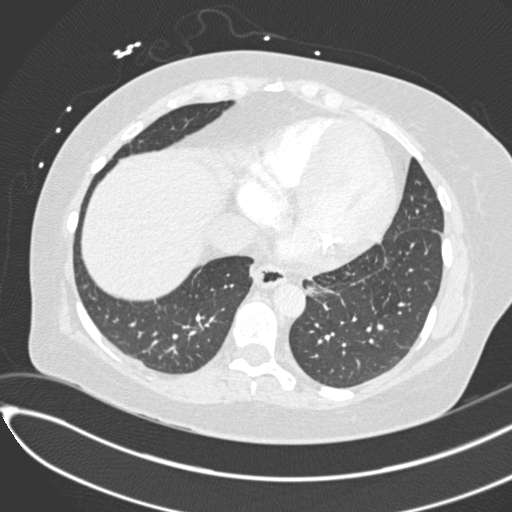
[im 69/276  mediastinal]
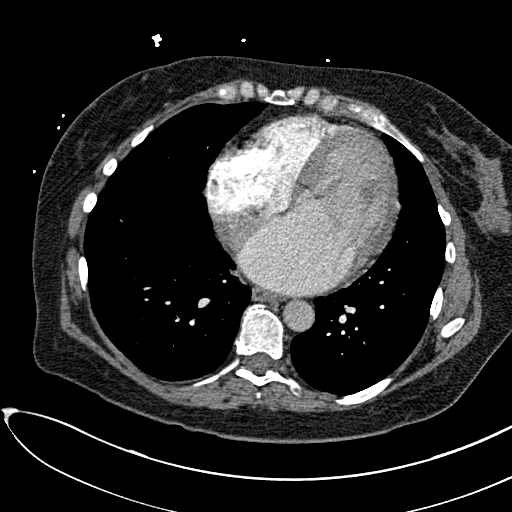
[im 83/276  lung]
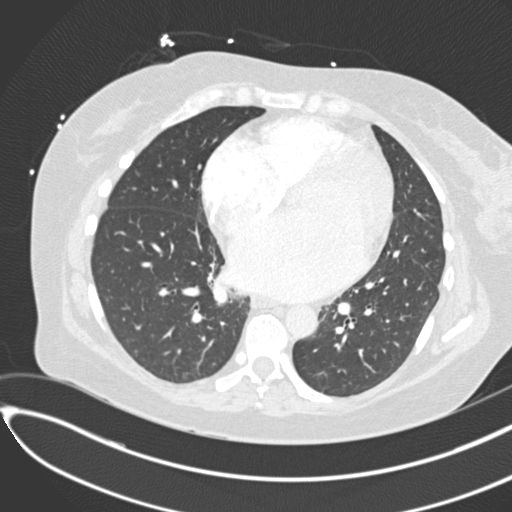
[im 92/276  mediastinal]
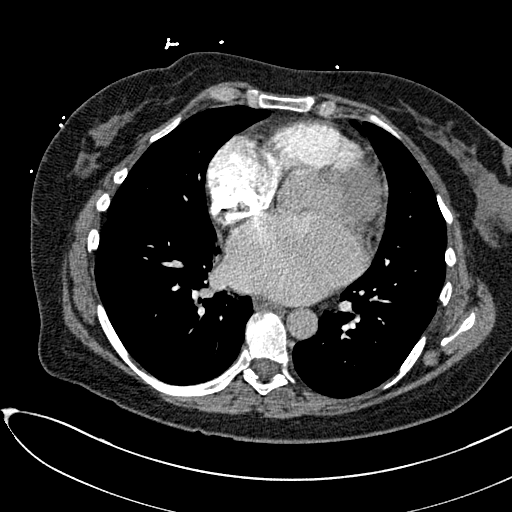
[im 97/276  lung]
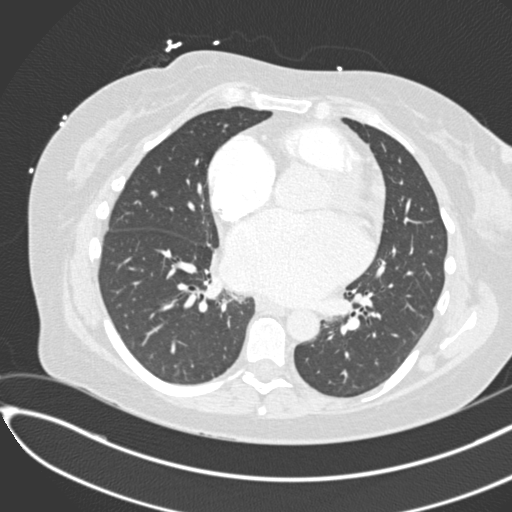
[im 111/276  mediastinal]
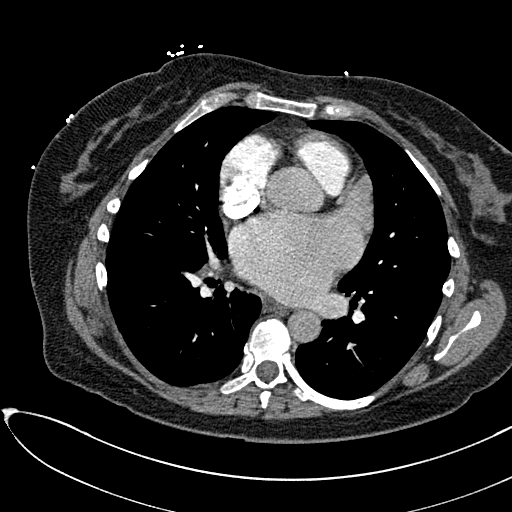
[im 124/276  lung]
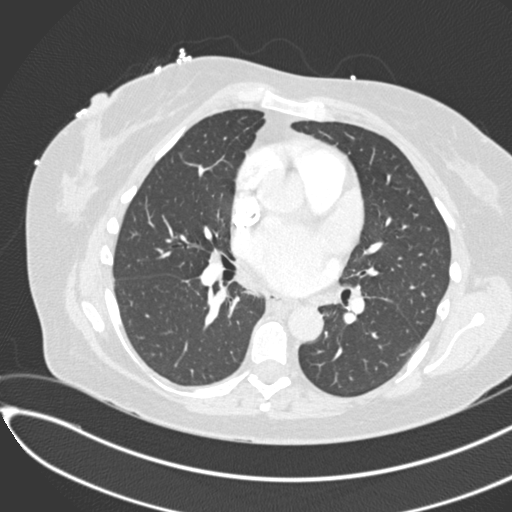
[im 138/276  mediastinal]
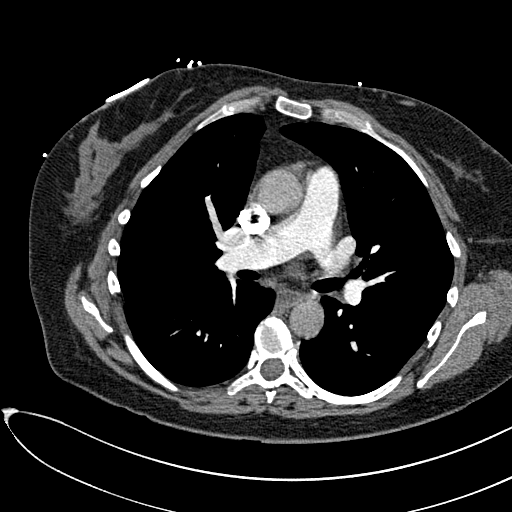
[im 152/276  lung]
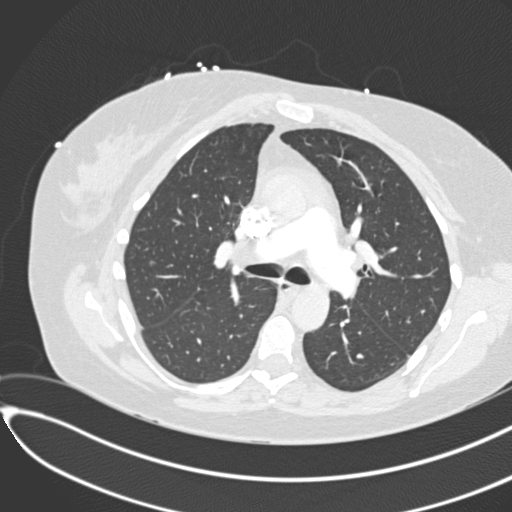
[im 166/276  mediastinal]
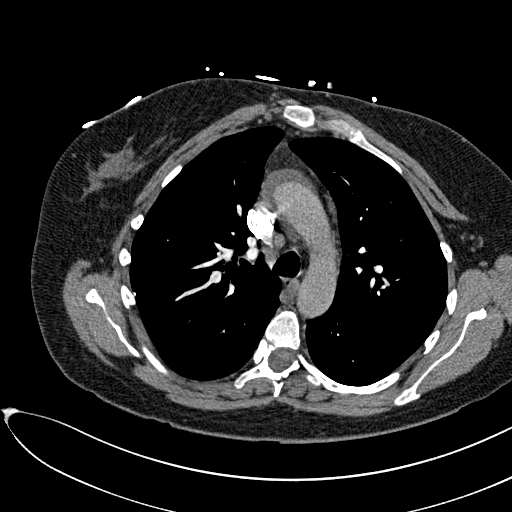
[im 179/276  lung]
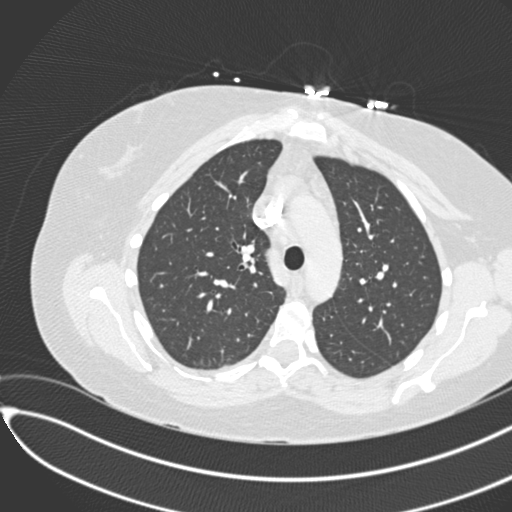
[im 184/276  mediastinal]
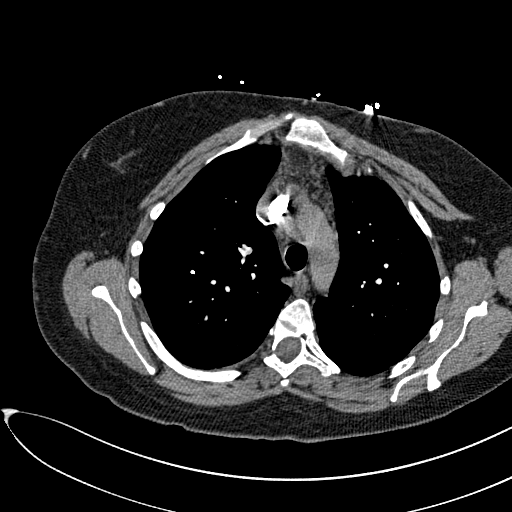
[im 193/276  lung]
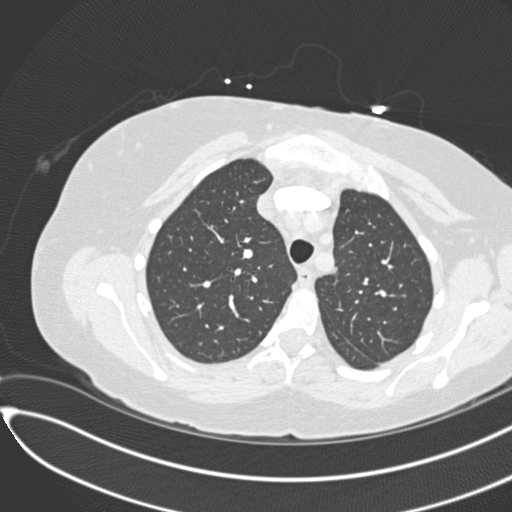
[im 207/276  mediastinal]
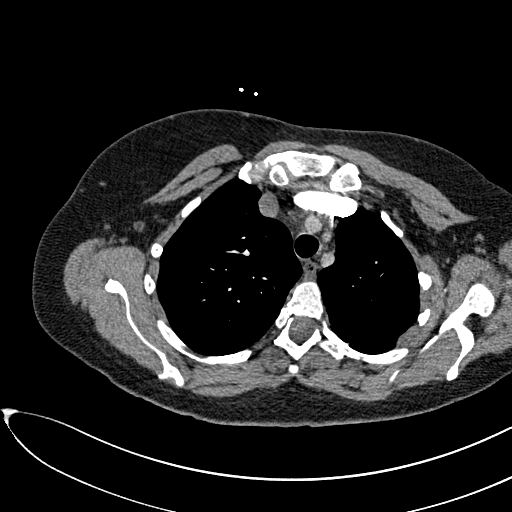
[im 234/276  lung]
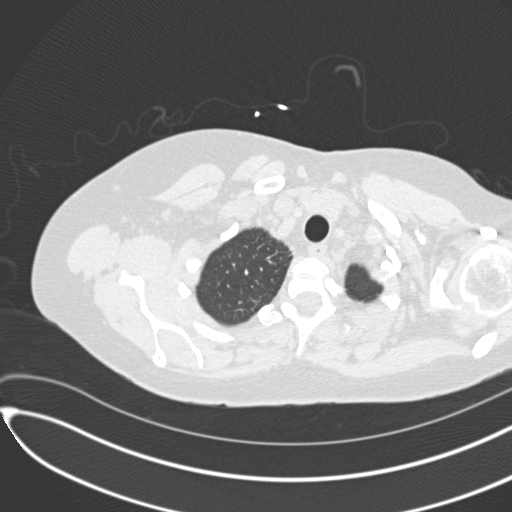
[im 248/276  mediastinal]
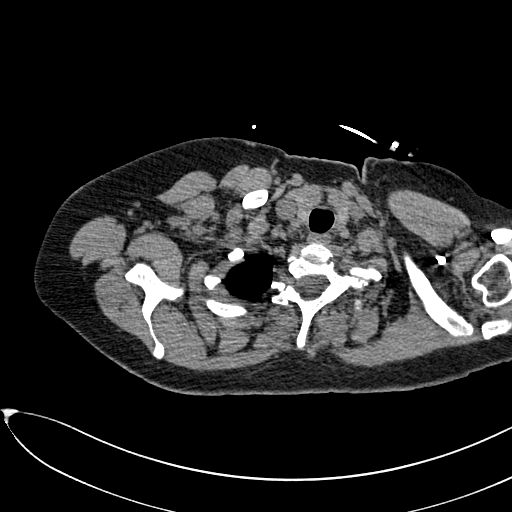
[im 262/276  lung]
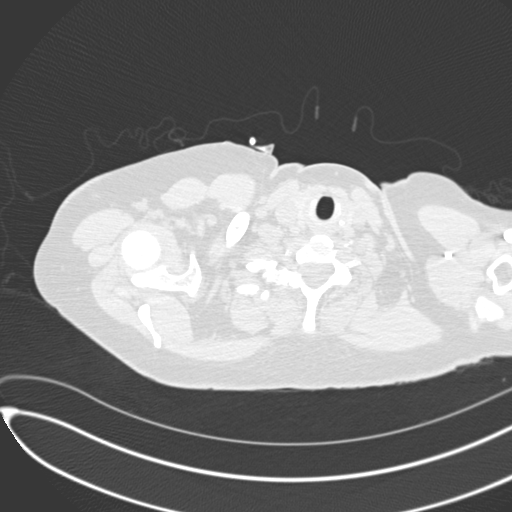

[19 of 32 positions shown; findings below may reference images not displayed]

FINDINGS: No axillary or supraclavicular lymphadenopathy.  There
are no filling defects within the pulmonary arteries to suggest
acute pulmonary embolism.  The no acute findings of the aorta or
great vessels.  No pericardial fluid.  The left atrium is enlarged.
The left ventricle is enlarged.

No mediastinal adenopathy.  Esophagus is normal.

Review lung parenchyma demonstrates no suspicious pulmonary
nodules.  No pleural fluid or consolidation.  No pneumothorax.
Limited view of the upper abdomen is unremarkable.

Limited view of the skeleton is unremarkable.

Review of the MIP images confirms the above findings.
IMPRESSION: 1.  No evidence of acute pulmonary embolism.
2.  Enlarged left heart.

## 2013-09-23 ENCOUNTER — Ambulatory Visit
Admission: RE | Admit: 2013-09-23 | Discharge: 2013-09-23 | Disposition: A | Discharge status: Home / Self Care | Payer: Medicaid Other | Source: Ambulatory Visit | Attending: Cardiovascular Disease | Admitting: Cardiovascular Disease

## 2013-09-23 ENCOUNTER — Inpatient Hospital Stay (HOSPITAL_COMMUNITY)
Admission: AD | Admit: 2013-09-23 | Discharge: 2013-09-25 | DRG: 202 | Disposition: A | Discharge status: Home / Self Care | Payer: Medicaid Other | Source: Ambulatory Visit | Attending: Cardiovascular Disease | Admitting: Cardiovascular Disease

## 2013-09-23 ENCOUNTER — Other Ambulatory Visit: Payer: Self-pay | Admitting: Cardiovascular Disease

## 2013-09-23 DIAGNOSIS — I4891 Unspecified atrial fibrillation: Secondary | ICD-10-CM | POA: Diagnosis present

## 2013-09-23 DIAGNOSIS — R05 Cough: Secondary | ICD-10-CM

## 2013-09-23 DIAGNOSIS — J45909 Unspecified asthma, uncomplicated: Principal | ICD-10-CM | POA: Diagnosis present

## 2013-09-23 DIAGNOSIS — Z23 Encounter for immunization: Secondary | ICD-10-CM

## 2013-09-23 DIAGNOSIS — Z833 Family history of diabetes mellitus: Secondary | ICD-10-CM

## 2013-09-23 DIAGNOSIS — E669 Obesity, unspecified: Secondary | ICD-10-CM | POA: Diagnosis present

## 2013-09-23 DIAGNOSIS — Z7901 Long term (current) use of anticoagulants: Secondary | ICD-10-CM

## 2013-09-23 DIAGNOSIS — Q8740 Marfan's syndrome, unspecified: Secondary | ICD-10-CM

## 2013-09-23 DIAGNOSIS — R7309 Other abnormal glucose: Secondary | ICD-10-CM | POA: Diagnosis present

## 2013-09-23 DIAGNOSIS — Z8249 Family history of ischemic heart disease and other diseases of the circulatory system: Secondary | ICD-10-CM

## 2013-09-23 LAB — CBC WITH DIFFERENTIAL/PLATELET
Basophils Absolute: 0 10*3/uL (ref 0.0–0.1)
Basophils Relative: 0 % (ref 0–1)
Eosinophils Absolute: 0.1 10*3/uL (ref 0.0–0.7)
Eosinophils Relative: 1 % (ref 0–5)
MCH: 31.5 pg (ref 26.0–34.0)
MCV: 94.6 fL (ref 78.0–100.0)
Neutrophils Relative %: 63 % (ref 43–77)
Platelets: 228 10*3/uL (ref 150–400)
RDW: 13.8 % (ref 11.5–15.5)
WBC: 5.6 10*3/uL (ref 4.0–10.5)

## 2013-09-23 LAB — COMPREHENSIVE METABOLIC PANEL
ALT: 18 U/L (ref 0–35)
CO2: 22 mEq/L (ref 19–32)
Calcium: 8.8 mg/dL (ref 8.4–10.5)
Creatinine, Ser: 1.14 mg/dL — ABNORMAL HIGH (ref 0.50–1.10)
GFR calc Af Amer: 64 mL/min — ABNORMAL LOW (ref >90)
GFR calc non Af Amer: 55 mL/min — ABNORMAL LOW (ref >90)
Glucose, Bld: 135 mg/dL — ABNORMAL HIGH (ref 70–99)
Sodium: 136 mEq/L (ref 135–145)
Total Protein: 6.7 g/dL (ref 6.0–8.3)

## 2013-09-23 LAB — PROTIME-INR
INR: 3.85 — ABNORMAL HIGH (ref 0.00–1.49)
Prothrombin Time: 36.4 seconds — ABNORMAL HIGH (ref 11.6–15.2)

## 2013-09-23 MED ORDER — HEPARIN SODIUM (PORCINE) 5000 UNIT/ML IJ SOLN: 5000.0000 [IU] | Freq: Three times a day (TID) | INTRAMUSCULAR | Status: DC

## 2013-09-23 MED ORDER — ACETAMINOPHEN 325 MG PO TABS
650.0000 mg | ORAL_TABLET | Freq: Four times a day (QID) | ORAL | Status: DC | PRN
  Administered 2013-09-23 – 2013-09-25 (×2): 650 mg via ORAL
  Filled 2013-09-23 (×2): qty 2

## 2013-09-23 MED ORDER — SODIUM CHLORIDE 0.9 % IV SOLN
INTRAVENOUS | Status: DC
  Administered 2013-09-23 – 2013-09-24 (×2): via INTRAVENOUS

## 2013-09-23 MED ORDER — ACETAMINOPHEN 650 MG RE SUPP: 650.0000 mg | Freq: Four times a day (QID) | RECTAL | Status: DC | PRN

## 2013-09-23 MED ORDER — ALPRAZOLAM 0.25 MG PO TABS
0.2500 mg | ORAL_TABLET | Freq: Every evening | ORAL | Status: DC | PRN
  Administered 2013-09-23 – 2013-09-24 (×2): 0.25 mg via ORAL
  Filled 2013-09-23 (×2): qty 1

## 2013-09-23 MED ORDER — LORATADINE 10 MG PO TABS
10.0000 mg | ORAL_TABLET | Freq: Every day | ORAL | Status: DC
  Administered 2013-09-23 – 2013-09-25 (×3): 10 mg via ORAL
  Filled 2013-09-23 (×3): qty 1

## 2013-09-23 MED ORDER — SODIUM CHLORIDE 0.9 % IJ SOLN
3.0000 mL | Freq: Two times a day (BID) | INTRAMUSCULAR | Status: DC
  Administered 2013-09-24: 3 mL via INTRAVENOUS

## 2013-09-23 MED ORDER — METHYLPREDNISOLONE SODIUM SUCC 125 MG IJ SOLR
125.0000 mg | Freq: Once | INTRAMUSCULAR | Status: AC
  Administered 2013-09-23: 125 mg via INTRAVENOUS
  Filled 2013-09-23: qty 2

## 2013-09-23 MED ORDER — ESTRADIOL 2 MG PO TABS
2.0000 mg | ORAL_TABLET | Freq: Every evening | ORAL | Status: DC
  Administered 2013-09-23 – 2013-09-24 (×2): 2 mg via ORAL
  Filled 2013-09-23 (×3): qty 1

## 2013-09-23 MED ORDER — WARFARIN SODIUM 3 MG PO TABS
3.0000 mg | ORAL_TABLET | Freq: Every evening | ORAL | Status: DC
  Filled 2013-09-23: qty 1

## 2013-09-23 MED ORDER — WARFARIN - PHYSICIAN DOSING INPATIENT: Freq: Every day | Status: DC

## 2013-09-23 MED ORDER — IPRATROPIUM BROMIDE 0.02 % IN SOLN
0.5000 mg | Freq: Four times a day (QID) | RESPIRATORY_TRACT | Status: DC
  Administered 2013-09-23 – 2013-09-24 (×2): 0.5 mg via RESPIRATORY_TRACT
  Filled 2013-09-23 (×3): qty 2.5

## 2013-09-23 MED ORDER — AMIODARONE HCL 200 MG PO TABS
200.0000 mg | ORAL_TABLET | Freq: Every morning | ORAL | Status: DC
  Administered 2013-09-24 – 2013-09-25 (×2): 200 mg via ORAL
  Filled 2013-09-23 (×2): qty 1

## 2013-09-23 MED ORDER — LEVALBUTEROL HCL 0.63 MG/3ML IN NEBU
0.6300 mg | INHALATION_SOLUTION | Freq: Four times a day (QID) | RESPIRATORY_TRACT | Status: DC
  Administered 2013-09-23 – 2013-09-24 (×2): 0.63 mg via RESPIRATORY_TRACT
  Filled 2013-09-23 (×7): qty 3

## 2013-09-23 MED ORDER — ONDANSETRON HCL 4 MG PO TABS: 4.0000 mg | ORAL_TABLET | Freq: Four times a day (QID) | ORAL | Status: DC | PRN

## 2013-09-23 MED ORDER — METOPROLOL TARTRATE 50 MG PO TABS
50.0000 mg | ORAL_TABLET | Freq: Two times a day (BID) | ORAL | Status: DC
  Administered 2013-09-23 – 2013-09-25 (×4): 50 mg via ORAL
  Filled 2013-09-23 (×5): qty 1

## 2013-09-23 MED ORDER — ALUM & MAG HYDROXIDE-SIMETH 200-200-20 MG/5ML PO SUSP: 30.0000 mL | Freq: Four times a day (QID) | ORAL | Status: DC | PRN

## 2013-09-23 MED ORDER — ONDANSETRON HCL 4 MG/2ML IJ SOLN: 4.0000 mg | Freq: Four times a day (QID) | INTRAMUSCULAR | Status: DC | PRN

## 2013-09-23 MED ORDER — ALBUTEROL SULFATE (5 MG/ML) 0.5% IN NEBU: 2.5000 mg | INHALATION_SOLUTION | Freq: Four times a day (QID) | RESPIRATORY_TRACT | Status: DC

## 2013-09-23 MED ORDER — GUAIFENESIN ER 600 MG PO TB12
600.0000 mg | ORAL_TABLET | Freq: Two times a day (BID) | ORAL | Status: DC
  Filled 2013-09-23 (×5): qty 1

## 2013-09-23 MED ORDER — DOCUSATE SODIUM 100 MG PO CAPS
100.0000 mg | ORAL_CAPSULE | Freq: Two times a day (BID) | ORAL | Status: DC
  Filled 2013-09-23 (×4): qty 1

## 2013-09-23 NOTE — H&P (Signed)
Brittany Newton is an 52 y.o. female.   Chief Complaint: Cough and shortness of breath. HPI: 52 year old female with 3-4 week history of cough and cold with shortness of breath not improving with antibiotics x 2 rounds.  Past Medical History  Diagnosis Date  . Dysrhythmia Sept 2012    A-Fib  . Cardiomegaly sept 2012    + Marfan's syndrome.  Past Surgical History  Procedure Laterality Date  . Abdominal hysterectomy  2009    complete   . Cesarean section  1994    emergency  . Hernia repair  1965    double hernia repair   Family history: Her father died of MI at 49. Her mother is alive, is 21 years old, has diabetes, coronary artery disease, is a cancer survivor. She has a 27 years old full sister with Marfan syndrome. She has an older half sister that has Marfan syndrome also.  Social History:  reports that she has never smoked. She does not have any smokeless tobacco history on file. She reports that she does not drink alcohol or use illicit drugs.  Allergies:  Allergies  Allergen Reactions  . Aspirin Nausea And Vomiting    Medications Prior to Admission  Medication Sig Dispense Refill  . acetaminophen (TYLENOL) 500 MG tablet Take 500 mg by mouth every 8 (eight) hours as needed for pain.      Marland Kitchen ALPRAZolam (XANAX) 0.25 MG tablet Take 0.25 mg by mouth at bedtime as needed for sleep. For anxiety.      Marland Kitchen amiodarone (PACERONE) 200 MG tablet Take 200 mg by mouth every morning.       Marland Kitchen amoxicillin-clavulanate (AUGMENTIN) 875-125 MG per tablet Take 1 tablet by mouth 2 (two) times daily. Continuous med      . estradiol (ESTRACE) 2 MG tablet Take 2 mg by mouth every evening.       . fexofenadine (ALLEGRA) 180 MG tablet Take 180 mg by mouth every evening.      . metoprolol (LOPRESSOR) 50 MG tablet Take 50 mg by mouth 2 (two) times daily.      Marland Kitchen warfarin (COUMADIN) 3 MG tablet Take 3 mg by mouth every evening.        No results found for this or any previous visit (from the past 48  hour(s)). Dg Chest 2 View  09/23/2013   CLINICAL DATA:  Cough. Fever. Sinus infection. Rule out pneumonia.  EXAM: CHEST  2 VIEW  COMPARISON:  05/21/2012  FINDINGS: Lateral view degraded by patient arm position. Midline trachea. Mild cardiomegaly. No pleural effusion or pneumothorax. Clear lungs. No congestive failure.  IMPRESSION: No evidence of pneumonia.  Cardiomegaly without congestive failure.   Electronically Signed   By: Jeronimo Greaves M.D.   On: 09/23/2013 15:43    ROS No weight gain, + chest pain, + asthma, No gastointestinal bleed, No blood transfusion, no hepatitis, No kidney stone, No CVA, seizures or psychiatric admissions  Blood pressure 135/82, pulse 126, temperature 97.7 F (36.5 C), temperature source Oral, resp. rate 21, height 6\' 2"  (1.88 m), weight 115.259 kg (254 lb 1.6 oz), SpO2 97.00%.  Physical Exam   GENERAL: She is awake, alert and oriented x3, in mild respiratory distress.  HEENT: PERl, EOMI, Conj- Pink, Sclera-no icterus. Throat-red, no exudate.  Neck: No JVD. No bruit. No thyromegaly.  CARDIOVASCULAR: She has irregular rate and rhythm. S1 and S2 are normal. II/VI systolic murmur.  LUNGS: Good air movement. Expiratory wheeze to auscultation.  ABDOMEN: Positive  bowel sounds, nontender, mildly distended.  EXTREMITIES: Positive pulses. No clubbing, cyanosis, or edema.  NEURO: Nonfocal. Skin: Warm and dry.  Assessment/Plan Bronchitis  Chronic atrial fibrillation Obesity Marfan's syndrome Chronic coumadin use   Place in observation/Breathing treatments/Home medications  Jamela Cumbo S 09/23/2013, 5:48 PM

## 2013-09-24 ENCOUNTER — Encounter (HOSPITAL_COMMUNITY): Payer: Self-pay | Admitting: *Deleted

## 2013-09-24 LAB — CBC
Hemoglobin: 13 g/dL (ref 12.0–15.0)
MCH: 31.6 pg (ref 26.0–34.0)
Platelets: 247 10*3/uL (ref 150–400)
RBC: 4.11 MIL/uL (ref 3.87–5.11)
RDW: 13.5 % (ref 11.5–15.5)
WBC: 3.1 10*3/uL — ABNORMAL LOW (ref 4.0–10.5)

## 2013-09-24 LAB — BASIC METABOLIC PANEL
Calcium: 8.9 mg/dL (ref 8.4–10.5)
Creatinine, Ser: 0.95 mg/dL (ref 0.50–1.10)
GFR calc Af Amer: 80 mL/min — ABNORMAL LOW (ref >90)
GFR calc non Af Amer: 69 mL/min — ABNORMAL LOW (ref >90)
Glucose, Bld: 165 mg/dL — ABNORMAL HIGH (ref 70–99)
Potassium: 3.8 mEq/L (ref 3.5–5.1)
Sodium: 133 mEq/L — ABNORMAL LOW (ref 135–145)

## 2013-09-24 LAB — PROTIME-INR: INR: 3.56 — ABNORMAL HIGH (ref 0.00–1.49)

## 2013-09-24 MED ORDER — INFLUENZA VAC SPLIT QUAD 0.5 ML IM SUSP
0.5000 mL | INTRAMUSCULAR | Status: DC
  Filled 2013-09-24: qty 0.5

## 2013-09-24 MED ORDER — METHYLPREDNISOLONE SODIUM SUCC 125 MG IJ SOLR
125.0000 mg | Freq: Once | INTRAMUSCULAR | Status: AC
  Administered 2013-09-24: 125 mg via INTRAVENOUS
  Filled 2013-09-24: qty 2

## 2013-09-24 MED ORDER — MENTHOL 3 MG MT LOZG
1.0000 | LOZENGE | OROMUCOSAL | Status: DC | PRN
  Administered 2013-09-24: 3 mg via ORAL
  Filled 2013-09-24 (×2): qty 9

## 2013-09-24 MED ORDER — MAGNESIUM SULFATE IN D5W 10-5 MG/ML-% IV SOLN
1.0000 g | Freq: Once | INTRAVENOUS | Status: AC
  Administered 2013-09-24: 1 g via INTRAVENOUS
  Filled 2013-09-24: qty 100

## 2013-09-24 NOTE — Progress Notes (Signed)
Pt lungs clear bilaterally, states she does not feel as if the treatments are helping. Pt says her throat and neck are "tight" however no stridor present. RT will monitor.

## 2013-09-24 NOTE — Care Management Note (Unsigned)
    Page 1 of 1   09/24/2013     3:30:28 PM   CARE MANAGEMENT NOTE 09/24/2013  Patient:  Brittany Newton, Brittany Newton   Account Number:  [id number]  Date Initiated:  09/24/2013  Documentation initiated by:  Uzziel Russey  Subjective/Objective Assessment:   PT ADM ON 09/23/13 WITH BRONCHITIS.  PTA,PT INDEPENDENT, LIVES WITH FAMILY     Action/Plan:   WILL FOLLOW FOR DISCHARGE NEEDS AS PT PROGRESSES.   Anticipated DC Date:  09/25/2013   Anticipated DC Plan:  HOME/SELF CARE      DC Planning Services  CM consult      Choice offered to / List presented to:             Status of service:  In process, will continue to follow Medicare Important Message given?   (If response is "NO", the following Medicare IM given date fields will be blank) Date Medicare IM given:   Date Additional Medicare IM given:    Discharge Disposition:    Per UR Regulation:  Reviewed for med. necessity/level of care/duration of stay  If discussed at Long Length of Stay Meetings, dates discussed:    Comments:

## 2013-09-24 NOTE — Progress Notes (Addendum)
09/24/2013 5:19 PM Nursing note Received call from CCMD that pt. Had three separate runs of SVT at 1713, longest run 8 beats, non-sustained.  Dr. Algie Coffer at desk and made aware as well as shown strip on monitor. MD to see patient. Pt. Did state she could feel a "fluttering" in her chest. Vital signs collected. Will await orders if indicated by MD.  Kelby Fam, Blanchard Kelch

## 2013-09-24 NOTE — Progress Notes (Signed)
UR COMPLETED  

## 2013-09-24 NOTE — Progress Notes (Signed)
Subjective:  Feels occasional palpitations. Still has Cough. Decreasing shortness of breath. Monitor showed no sustained VT.  Objective:  Vital Signs in the last 24 hours: Temp:  [97.4 F (36.3 C)-98.2 F (36.8 C)] 98.2 F (36.8 C) (11/04 1325) Pulse Rate:  [91-113] 91 (11/04 1325) Cardiac Rhythm:  [-] Atrial fibrillation (11/04 0813) Resp:  [18-20] 18 (11/04 1325) BP: (98-133)/(60-86) 113/75 mmHg (11/04 1325) SpO2:  [95 %-99 %] 98 % (11/04 1325) Weight:  [114.5 kg (252 lb 6.8 oz)] 114.5 kg (252 lb 6.8 oz) (11/04 0405)  Physical Exam: BP Readings from Last 1 Encounters:  09/24/13 113/75     Wt Readings from Last 1 Encounters:  09/24/13 114.5 kg (252 lb 6.8 oz)    Weight change:   HEENT: [place]/AT, Eyes-Brown, PERL, EOMI, Conjunctiva-Pink, Sclera-Non-icteric Neck: No JVD, No bruit, Trachea midline. Lungs:  Clear, Bilateral. Cardiac:  Regular rhythm, normal S1 and S2, no S3.  Abdomen:  Soft, non-tender. Extremities:  No edema present. No cyanosis. No clubbing. CNS: AxOx3, Cranial nerves grossly intact, moves all 4 extremities. Right handed. Skin: Warm and dry.   Intake/Output from previous day:      Lab Results: BMET    Component Value Date/Time   NA 133* 09/24/2013 0442   K 3.8 09/24/2013 0442   CL 102 09/24/2013 0442   CO2 21 09/24/2013 0442   GLUCOSE 165* 09/24/2013 0442   BUN 15 09/24/2013 0442   CREATININE 0.95 09/24/2013 0442   CALCIUM 8.9 09/24/2013 0442   GFRNONAA 69* 09/24/2013 0442   GFRAA 80* 09/24/2013 0442   CBC    Component Value Date/Time   WBC 3.1* 09/24/2013 0442   RBC 4.11 09/24/2013 0442   HGB 13.0 09/24/2013 0442   HCT 38.1 09/24/2013 0442   PLT 247 09/24/2013 0442   MCV 92.7 09/24/2013 0442   MCH 31.6 09/24/2013 0442   MCHC 34.1 09/24/2013 0442   RDW 13.5 09/24/2013 0442   LYMPHSABS 1.5 09/23/2013 1925   MONOABS 0.5 09/23/2013 1925   EOSABS 0.1 09/23/2013 1925   BASOSABS 0.0 09/23/2013 1925   CARDIAC ENZYMES Lab Results  Component Value Date   CKTOTAL  72 07/24/2011   CKMB 1.7 07/24/2011   TROPONINI <0.30 07/24/2011    Scheduled Meds: . amiodarone  200 mg Oral q morning - 10a  . docusate sodium  100 mg Oral BID  . estradiol  2 mg Oral QPM  . guaiFENesin  600 mg Oral BID  . [START ON 09/25/2013] influenza vac split quadrivalent PF  0.5 mL Intramuscular Tomorrow-1000  . ipratropium  0.5 mg Nebulization Q6H  . levalbuterol  0.63 mg Nebulization Q6H  . loratadine  10 mg Oral Daily  . magnesium sulfate 1 - 4 g bolus IVPB  1 g Intravenous Once  . metoprolol  50 mg Oral BID  . sodium chloride  3 mL Intravenous Q12H  . Warfarin - Physician Dosing Inpatient   Does not apply q1800   Continuous Infusions: . sodium chloride 20 mL/hr at 09/24/13 1342   PRN Meds:.acetaminophen, acetaminophen, ALPRAZolam, alum & mag hydroxide-simeth, menthol-cetylpyridinium, ondansetron (ZOFRAN) IV, ondansetron  Assessment/Plan: Asthmatic bronchitis  Chronic atrial fibrillation  Obesity  Marfan's syndrome  Chronic coumadin use  Non sustained VT  IV magnesium/Echocardiogram and Blood work in AM    LOS: 1 day    Orpah Cobb  MD  09/24/2013, 5:43 PM

## 2013-09-25 LAB — BASIC METABOLIC PANEL
BUN: 13 mg/dL (ref 6–23)
CO2: 22 mEq/L (ref 19–32)
Chloride: 105 mEq/L (ref 96–112)
Creatinine, Ser: 0.78 mg/dL (ref 0.50–1.10)
GFR calc Af Amer: >90 mL/min (ref >90)
Glucose, Bld: 130 mg/dL — ABNORMAL HIGH (ref 70–99)
Sodium: 137 mEq/L (ref 135–145)

## 2013-09-25 LAB — PROTIME-INR: Prothrombin Time: 31.5 seconds — ABNORMAL HIGH (ref 11.6–15.2)

## 2013-09-25 MED ORDER — WARFARIN SODIUM 3 MG PO TABS: 3.0000 mg | ORAL_TABLET | Freq: Every evening | ORAL | Status: DC

## 2013-09-25 MED ORDER — PREDNISONE 20 MG PO TABS
20.0000 mg | ORAL_TABLET | Freq: Every day | ORAL | Status: DC
  Filled 2013-09-25: qty 1

## 2013-09-25 MED ORDER — METOPROLOL TARTRATE 50 MG PO TABS: 75.0000 mg | ORAL_TABLET | Freq: Two times a day (BID) | ORAL | Status: DC

## 2013-09-25 MED ORDER — IPRATROPIUM BROMIDE 0.02 % IN SOLN: 0.5000 mg | Freq: Four times a day (QID) | RESPIRATORY_TRACT | Status: DC | PRN

## 2013-09-25 MED ORDER — LEVALBUTEROL HCL 0.63 MG/3ML IN NEBU: 0.6300 mg | INHALATION_SOLUTION | Freq: Four times a day (QID) | RESPIRATORY_TRACT | Status: DC | PRN

## 2013-09-25 MED ORDER — GUAIFENESIN ER 600 MG PO TB12: 600.0000 mg | ORAL_TABLET | Freq: Two times a day (BID) | ORAL | Status: DC

## 2013-09-25 MED ORDER — HYDROCOD POLST-CHLORPHEN POLST 10-8 MG/5ML PO LQCR: 5.0000 mL | Freq: Two times a day (BID) | ORAL | Status: DC | PRN

## 2013-09-25 NOTE — Discharge Summary (Signed)
Physician Discharge Summary  Patient ID: Brittany Newton MRN: [id number] DOB/AGE: 1961-02-16 52 y.o.  Admit date: 09/23/2013 Discharge date: 09/25/2013  Admission Diagnoses: Asthmatic bronchitis  Chronic atrial fibrillation  Obesity  Marfan's syndrome  Chronic coumadin use  Non sustained VT  Discharge Diagnoses:  Principle Problem: * Asthmatic bronchitis * Chronic atrial fibrillation  Obesity  Marfan's syndrome  Chronic coumadin use  Non sustained VT Hyperglycemia  Discharged Condition: fair  Hospital Course: 52 year old female with 3-4 week history of cough and cold with shortness of breath not improving with antibiotics x 2 rounds. She responded well to IV solumedrol, followed by PO prednisone for 4-5 days only. Her left atrium is markedly enlarged, possibly contributing to cough as well. She has MRI of chest/heart scheduled by Cardiology at [facility] for her Marfan's syndrome. Her coumadin dose is decreased to 3 mg. daily except Sunday. She will be followed in 2-3 weeks.  Consults: None  Significant Diagnostic Studies: labs: Normal CBC, mildly elevated sugars. Chest X-ray-normal.  Echocardiogram:- Left ventricle: The cavity size was mildly dilated. There was moderate concentric hypertrophy. Systolic function was normal. The estimated ejection fraction was in the range of 50% to 55%. Wall motion was normal; there were no regional wall motion abnormalities. - Aortic valve: Mild regurgitation. - Mitral valve: Asymmetrically calcified annulus. Mild myxomatous degeneration. Moderate regurgitation. - Left atrium: The atrium was severely dilated.  Treatments: steroids: solu-medrol and home medications.   Discharge Exam: Blood pressure 120/71, pulse 88, temperature 99 F (37.2 C), temperature source Oral, resp. rate 18, height 6\' 2"  (1.88 m), weight 114.5 kg (252 lb 6.8 oz), SpO2 98.00%.  GENERAL: She is awake, alert and oriented x 3, in no respiratory distress.  HEENT: PERl, EOMI, Conj-  Pink, Sclera-no icterus. Throat-red, no exudate.  Neck: No JVD. No bruit. No thyromegaly.  CARDIOVASCULAR: She has irregular rate and rhythm. S1 and S2 are normal. II/VI systolic murmur.  LUNGS: Good air movement. No expiratory wheeze to auscultation.  ABDOMEN: Positive bowel sounds, nontender, mildly distended.  EXTREMITIES: Positive pulses. No clubbing, cyanosis, or edema.  NEURO: Nonfocal. Moves all 4 extremities. Skin: Warm and dry.  Disposition: 01-Home or Self Care     Medication List    STOP taking these medications       amoxicillin-clavulanate 875-125 MG per tablet  Commonly known as:  AUGMENTIN      TAKE these medications       acetaminophen 500 MG tablet  Commonly known as:  TYLENOL  Take 500 mg by mouth every 8 (eight) hours as needed for pain.     ALPRAZolam 0.25 MG tablet  Commonly known as:  XANAX  Take 0.25 mg by mouth at bedtime as needed for sleep. For anxiety.     amiodarone 200 MG tablet  Commonly known as:  PACERONE  Take 200 mg by mouth every morning.     chlorpheniramine-HYDROcodone 10-8 MG/5ML Lqcr  Commonly known as:  TUSSIONEX  Take 5 mLs by mouth every 12 (twelve) hours as needed for cough.     estradiol 2 MG tablet  Commonly known as:  ESTRACE  Take 2 mg by mouth every evening.     fexofenadine 180 MG tablet  Commonly known as:  ALLEGRA  Take 180 mg by mouth every evening.     guaiFENesin 600 MG 12 hr tablet  Commonly known as:  MUCINEX  Take 1 tablet (600 mg total) by mouth 2 (two) times daily.     metoprolol 50 MG  tablet  Commonly known as:  LOPRESSOR  Take 1.5 tablets (75 mg total) by mouth 2 (two) times daily.     warfarin 3 MG tablet  Commonly known as:  COUMADIN  Take 1 tablet (3 mg total) by mouth every evening. Take 1 tablet (3 mg) by mouth every evening except none on Sunday         Signed: Brion Hedges S 09/25/2013, 1:26 PM

## 2013-09-25 NOTE — Progress Notes (Signed)
Echocardiogram 2D Echocardiogram has been performed.  Brittany Newton 09/25/2013, 9:22 AM

## 2013-11-02 ENCOUNTER — Emergency Department (HOSPITAL_COMMUNITY)
Admission: EM | Admit: 2013-11-02 | Discharge: 2013-11-02 | Disposition: A | Discharge status: Home / Self Care | Payer: Medicaid Other | Source: Home / Self Care | Attending: Family Medicine | Admitting: Family Medicine

## 2013-11-02 ENCOUNTER — Encounter (HOSPITAL_COMMUNITY): Payer: Self-pay | Admitting: Emergency Medicine

## 2013-11-02 DIAGNOSIS — J0100 Acute maxillary sinusitis, unspecified: Secondary | ICD-10-CM

## 2013-11-02 DIAGNOSIS — I4891 Unspecified atrial fibrillation: Secondary | ICD-10-CM

## 2013-11-02 DIAGNOSIS — I482 Chronic atrial fibrillation, unspecified: Secondary | ICD-10-CM

## 2013-11-02 MED ORDER — CLINDAMYCIN HCL 300 MG PO CAPS: 300.0000 mg | ORAL_CAPSULE | Freq: Three times a day (TID) | ORAL | Status: DC

## 2013-11-02 NOTE — ED Provider Notes (Signed)
CSN: [id number]     Arrival date & time 11/02/13  0920 History   First MD Initiated Contact with Patient 11/02/13 [phone]     Chief Complaint  Patient presents with  . Sinusitis   (Consider location/radiation/quality/duration/timing/severity/associated sxs/prior Treatment) Patient is a 52 y.o. female presenting with sinusitis. The history is provided by the patient.  Sinusitis Pain details:    Location:  Maxillary   Quality:  Pressure and aching   Severity:  Moderate   Duration:  1 week Progression:  Worsening Chronicity:  Recurrent Relieved by:  Saline sprays Ineffective treatments:  Steroid sprays Associated symptoms: cough, headaches and rhinorrhea   Associated symptoms: no chills, no fever and no shortness of breath   Risk factors: immune deficiency     Past Medical History  Diagnosis Date  . Dysrhythmia Sept 2012    A-Fib  . Cardiomegaly sept 2012   Past Surgical History  Procedure Laterality Date  . Abdominal hysterectomy  2009    complete   . Cesarean section  1994    emergency  . Hernia repair  1965    double hernia repair   No family history on file. History  Substance Use Topics  . Smoking status: Never Smoker   . Smokeless tobacco: Not on file  . Alcohol Use: No   OB History   Grav Para Term Preterm Abortions TAB SAB Ect Mult Living                 Review of Systems  Constitutional: Negative for fever and chills.  HENT: Positive for rhinorrhea.   Respiratory: Positive for cough. Negative for shortness of breath.   Cardiovascular: Positive for palpitations.  Neurological: Positive for headaches.    Allergies  Aspirin  Home Medications   Current Outpatient Rx  Name  Route  Sig  Dispense  Refill  . ALPRAZolam (XANAX) 0.25 MG tablet   Oral   Take 0.25 mg by mouth at bedtime as needed for sleep. For anxiety.         Marland Kitchen amiodarone (PACERONE) 200 MG tablet   Oral   Take 200 mg by mouth every morning.          Marland Kitchen estradiol (ESTRACE) 2 MG  tablet   Oral   Take 2 mg by mouth every evening.          . fexofenadine (ALLEGRA) 180 MG tablet   Oral   Take 180 mg by mouth every evening.         . metoprolol (LOPRESSOR) 50 MG tablet   Oral   Take 1.5 tablets (75 mg total) by mouth 2 (two) times daily.   90 tablet   3   . warfarin (COUMADIN) 3 MG tablet   Oral   Take 1 tablet (3 mg total) by mouth every evening. Take 1 tablet (3 mg) by mouth every evening except none on Sunday         . acetaminophen (TYLENOL) 500 MG tablet   Oral   Take 500 mg by mouth every 8 (eight) hours as needed for pain.         . chlorpheniramine-HYDROcodone (TUSSIONEX) 10-8 MG/5ML LQCR   Oral   Take 5 mLs by mouth every 12 (twelve) hours as needed for cough.   120 mL   0   . clindamycin (CLEOCIN) 300 MG capsule   Oral   Take 1 capsule (300 mg total) by mouth 3 (three) times daily.   30 capsule  0   . guaiFENesin (MUCINEX) 600 MG 12 hr tablet   Oral   Take 1 tablet (600 mg total) by mouth 2 (two) times daily.   10 tablet   0    BP 129/79  Pulse 89  Temp(Src) 97.2 F (36.2 C) (Oral)  Resp 18  SpO2 98% Physical Exam  Constitutional: She is oriented to person, place, and time. She appears well-developed and well-nourished.  HENT:  Head: Normocephalic.  Right Ear: External ear normal.  Left Ear: External ear normal.  Nose: Mucosal edema and rhinorrhea present. Right sinus exhibits maxillary sinus tenderness. Left sinus exhibits maxillary sinus tenderness.  Mouth/Throat: Oropharynx is clear and moist.  Eyes: Conjunctivae are normal. Pupils are equal, round, and reactive to light.  Neck: Normal range of motion. Neck supple.  Cardiovascular: Normal pulses.  An irregularly irregular rhythm present.  Murmur heard. Pulmonary/Chest: Breath sounds normal.  Neurological: She is alert and oriented to person, place, and time.  Skin: Skin is warm and dry.    ED Course  Procedures (including critical care time) Labs Review Labs  Reviewed - No data to display Imaging Review No results found.  EKG Interpretation    Date/Time:    Ventricular Rate:    PR Interval:    QRS Duration:   QT Interval:    QTC Calculation:   R Axis:     Text Interpretation:              MDM   1. Sinusitis, acute, maxillary   2. Atrial fibrillation, chronic        Linna Hoff, MD 11/02/13 1024

## 2013-11-02 NOTE — ED Notes (Signed)
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

## 2014-01-28 ENCOUNTER — Other Ambulatory Visit: Payer: Self-pay | Admitting: Cardiovascular Disease

## 2014-01-28 ENCOUNTER — Ambulatory Visit
Admission: RE | Admit: 2014-01-28 | Discharge: 2014-01-28 | Disposition: A | Discharge status: Home / Self Care | Payer: Medicaid Other | Source: Ambulatory Visit | Attending: Cardiovascular Disease | Admitting: Cardiovascular Disease

## 2014-01-28 DIAGNOSIS — M7500 Adhesive capsulitis of unspecified shoulder: Secondary | ICD-10-CM

## 2014-03-13 ENCOUNTER — Other Ambulatory Visit (HOSPITAL_COMMUNITY): Payer: Self-pay | Admitting: Endocrinology

## 2014-03-13 DIAGNOSIS — Z9229 Personal history of other drug therapy: Secondary | ICD-10-CM

## 2014-03-13 DIAGNOSIS — E0590 Thyrotoxicosis, unspecified without thyrotoxic crisis or storm: Secondary | ICD-10-CM | POA: Insufficient documentation

## 2014-03-19 ENCOUNTER — Ambulatory Visit (HOSPITAL_COMMUNITY)
Admission: RE | Admit: 2014-03-19 | Discharge: 2014-03-19 | Disposition: A | Discharge status: Home / Self Care | Payer: Medicaid Other | Source: Ambulatory Visit | Attending: Endocrinology | Admitting: Endocrinology

## 2014-03-19 DIAGNOSIS — E0590 Thyrotoxicosis, unspecified without thyrotoxic crisis or storm: Secondary | ICD-10-CM | POA: Insufficient documentation

## 2014-03-19 DIAGNOSIS — Z9229 Personal history of other drug therapy: Secondary | ICD-10-CM

## 2014-04-16 DIAGNOSIS — I482 Chronic atrial fibrillation, unspecified: Secondary | ICD-10-CM | POA: Insufficient documentation

## 2014-07-31 DIAGNOSIS — E538 Deficiency of other specified B group vitamins: Secondary | ICD-10-CM | POA: Insufficient documentation

## 2014-08-16 ENCOUNTER — Encounter (HOSPITAL_COMMUNITY): Payer: Self-pay | Admitting: Emergency Medicine

## 2014-08-16 ENCOUNTER — Emergency Department (INDEPENDENT_AMBULATORY_CARE_PROVIDER_SITE_OTHER)
Admission: EM | Admit: 2014-08-16 | Discharge: 2014-08-16 | Disposition: A | Discharge status: Home / Self Care | Payer: Medicaid Other | Source: Home / Self Care | Attending: Family Medicine | Admitting: Family Medicine

## 2014-08-16 DIAGNOSIS — H109 Unspecified conjunctivitis: Secondary | ICD-10-CM

## 2014-08-16 MED ORDER — POLYMYXIN B-TRIMETHOPRIM 10000-0.1 UNIT/ML-% OP SOLN: 1.0000 [drp] | OPHTHALMIC | Status: DC

## 2014-08-16 NOTE — Discharge Instructions (Signed)

## 2014-08-16 NOTE — ED Provider Notes (Signed)
CSN: [id number]     Arrival date & time 08/16/14  7829 History   First MD Initiated Contact with Patient 08/16/14 1002     Chief Complaint  Patient presents with  . Eye Problem   (Consider location/radiation/quality/duration/timing/severity/associated sxs/prior Treatment) HPI Comments: 53 year old female presents complaining of possible conjunctivitis. She has right eye redness, eyelid swelling, purulent drainage, itching. This started a few days ago and has gotten progressively worse. Her son recently he also had conjunctivitis. She denies any systemic symptoms. She uses contacts but has not worn them in the past week.  Patient is a 53 y.o. female presenting with eye problem.  Eye Problem Associated symptoms: discharge, itching and redness     Past Medical History  Diagnosis Date  . Dysrhythmia Sept 2012    A-Fib  . Cardiomegaly sept 2012  . Marfan syndrome    Past Surgical History  Procedure Laterality Date  . Abdominal hysterectomy  2009    complete   . Cesarean section  1994    emergency  . Hernia repair  1965    double hernia repair   History reviewed. No pertinent family history. History  Substance Use Topics  . Smoking status: Never Smoker   . Smokeless tobacco: Not on file  . Alcohol Use: No   OB History   Grav Para Term Preterm Abortions TAB SAB Ect Mult Living                 Review of Systems  Eyes: Positive for discharge, redness and itching.  All other systems reviewed and are negative.   Allergies  Aspirin  Home Medications   Prior to Admission medications   Medication Sig Start Date End Date Taking? Authorizing Provider  acetaminophen (TYLENOL) 500 MG tablet Take 500 mg by mouth every 8 (eight) hours as needed for pain.    Historical Provider, MD  ALPRAZolam Prudy Feeler) 0.25 MG tablet Take 0.25 mg by mouth at bedtime as needed for sleep. For anxiety.    Historical Provider, MD  amiodarone (PACERONE) 200 MG tablet Take 200 mg by mouth every morning.      Historical Provider, MD  chlorpheniramine-HYDROcodone (TUSSIONEX) 10-8 MG/5ML LQCR Take 5 mLs by mouth every 12 (twelve) hours as needed for cough. 09/25/13   Ricki Rodriguez, MD  clindamycin (CLEOCIN) 300 MG capsule Take 1 capsule (300 mg total) by mouth 3 (three) times daily. 11/02/13   Linna Hoff, MD  estradiol (ESTRACE) 2 MG tablet Take 2 mg by mouth every evening.     Historical Provider, MD  fexofenadine (ALLEGRA) 180 MG tablet Take 180 mg by mouth every evening.    Historical Provider, MD  guaiFENesin (MUCINEX) 600 MG 12 hr tablet Take 1 tablet (600 mg total) by mouth 2 (two) times daily. 09/25/13   Ricki Rodriguez, MD  metoprolol (LOPRESSOR) 50 MG tablet Take 1.5 tablets (75 mg total) by mouth 2 (two) times daily. 09/25/13   Ricki Rodriguez, MD  trimethoprim-polymyxin b (POLYTRIM) ophthalmic solution Place 1 drop into the right eye every 3 (three) hours. While awake, up to 6 doses per day 08/16/14   Graylon Good, PA-C  warfarin (COUMADIN) 3 MG tablet Take 1 tablet (3 mg total) by mouth every evening. Take 1 tablet (3 mg) by mouth every evening except none on Sunday 09/25/13   Ricki Rodriguez, MD   BP 104/65  Pulse 78  Temp(Src) 97.7 F (36.5 C) (Oral)  Resp 16  SpO2 97% Physical Exam  Nursing note and vitals reviewed. Constitutional: She is oriented to person, place, and time. Vital signs are normal. She appears well-developed and well-nourished. No distress.  HENT:  Head: Normocephalic and atraumatic.  Nose: Right sinus exhibits no maxillary sinus tenderness and no frontal sinus tenderness. Left sinus exhibits no maxillary sinus tenderness and no frontal sinus tenderness.  Eyes: EOM are normal. Pupils are equal, round, and reactive to light. Right eye exhibits discharge. Left eye exhibits no discharge. Right conjunctiva is injected. Left conjunctiva is not injected.  Pulmonary/Chest: Effort normal. No respiratory distress.  Lymphadenopathy:    She has no cervical adenopathy.   Neurological: She is alert and oriented to person, place, and time. She has normal strength. Coordination normal.  Skin: Skin is warm and dry. No rash noted. She is not diaphoretic.  Psychiatric: She has a normal mood and affect. Judgment normal.    ED Course  Procedures (including critical care time) Labs Review Labs Reviewed - No data to display  Imaging Review No results found.   MDM   1. Conjunctivitis of right eye    Conjunctivitis, treat with Polytrim drops. Followup when necessary.   Meds ordered this encounter  Medications  . trimethoprim-polymyxin b (POLYTRIM) ophthalmic solution    Sig: Place 1 drop into the right eye every 3 (three) hours. While awake, up to 6 doses per day    Dispense:  10 mL    Refill:  0    Order Specific Question:  Supervising Provider    Answer:  Bradd Canary D [[id number]]       Graylon Good, PA-C 08/16/14 1045

## 2014-08-16 NOTE — ED Notes (Signed)
pT  HAS  RED  IRRITATED  R  EYE   FOR  SEV  DAYS   WOKE  UP  WITH  EYE  MATTED  TOGETHER

## 2014-08-16 NOTE — ED Provider Notes (Signed)
Medical screening examination/treatment/procedure(s) were performed by resident physician or non-physician practitioner and as supervising physician I was immediately available for consultation/collaboration.   Kyrianna Barletta DOUGLAS MD.   Tayvon Culley D Johnwilliam Shepperson, MD 08/16/14 1430 

## 2014-10-15 ENCOUNTER — Other Ambulatory Visit (HOSPITAL_COMMUNITY): Payer: Self-pay | Admitting: Endocrinology

## 2014-10-15 DIAGNOSIS — E0590 Thyrotoxicosis, unspecified without thyrotoxic crisis or storm: Secondary | ICD-10-CM

## 2014-10-24 ENCOUNTER — Ambulatory Visit (HOSPITAL_COMMUNITY): Payer: Medicaid Other

## 2014-10-24 ENCOUNTER — Ambulatory Visit (HOSPITAL_COMMUNITY)
Admission: RE | Admit: 2014-10-24 | Discharge: 2014-10-24 | Disposition: A | Discharge status: Home / Self Care | Payer: Medicaid Other | Source: Ambulatory Visit | Attending: Endocrinology | Admitting: Endocrinology

## 2014-10-24 DIAGNOSIS — E0590 Thyrotoxicosis, unspecified without thyrotoxic crisis or storm: Secondary | ICD-10-CM | POA: Diagnosis present

## 2014-10-24 DIAGNOSIS — E042 Nontoxic multinodular goiter: Secondary | ICD-10-CM | POA: Diagnosis not present

## 2014-10-25 ENCOUNTER — Encounter (HOSPITAL_COMMUNITY): Payer: Self-pay | Admitting: *Deleted

## 2014-10-25 ENCOUNTER — Emergency Department (HOSPITAL_COMMUNITY)
Admission: EM | Admit: 2014-10-25 | Discharge: 2014-10-25 | Disposition: A | Discharge status: Home / Self Care | Payer: Medicaid Other | Attending: Emergency Medicine | Admitting: Emergency Medicine

## 2014-10-25 DIAGNOSIS — I4891 Unspecified atrial fibrillation: Secondary | ICD-10-CM | POA: Diagnosis not present

## 2014-10-25 DIAGNOSIS — Z792 Long term (current) use of antibiotics: Secondary | ICD-10-CM | POA: Insufficient documentation

## 2014-10-25 DIAGNOSIS — B029 Zoster without complications: Secondary | ICD-10-CM

## 2014-10-25 DIAGNOSIS — R21 Rash and other nonspecific skin eruption: Secondary | ICD-10-CM | POA: Diagnosis present

## 2014-10-25 DIAGNOSIS — Q8740 Marfan's syndrome, unspecified: Secondary | ICD-10-CM | POA: Insufficient documentation

## 2014-10-25 DIAGNOSIS — Z79899 Other long term (current) drug therapy: Secondary | ICD-10-CM | POA: Diagnosis not present

## 2014-10-25 DIAGNOSIS — B0230 Zoster ocular disease, unspecified: Secondary | ICD-10-CM | POA: Insufficient documentation

## 2014-10-25 MED ORDER — TETRACAINE HCL 0.5 % OP SOLN
1.0000 [drp] | Freq: Once | OPHTHALMIC | Status: AC
  Administered 2014-10-25: 2 [drp] via OPHTHALMIC
  Filled 2014-10-25: qty 2

## 2014-10-25 MED ORDER — TETRAHYDROZ-DEXTRAN-PEG-POVID 0.05-0.1-1-1 % OP SOLN: 2.0000 [drp] | OPHTHALMIC | Status: DC | PRN

## 2014-10-25 MED ORDER — HYDROCODONE-ACETAMINOPHEN 5-325 MG PO TABS: 1.0000 | ORAL_TABLET | Freq: Four times a day (QID) | ORAL | Status: DC | PRN

## 2014-10-25 MED ORDER — VALACYCLOVIR HCL 1 G PO TABS: 1000.0000 mg | ORAL_TABLET | Freq: Three times a day (TID) | ORAL | Status: AC

## 2014-10-25 MED ORDER — FLUORESCEIN SODIUM 1 MG OP STRP
1.0000 | ORAL_STRIP | Freq: Once | OPHTHALMIC | Status: AC
  Administered 2014-10-25: 1 via OPHTHALMIC
  Filled 2014-10-25: qty 1

## 2014-10-25 NOTE — ED Provider Notes (Signed)
CSN: [id number]     Arrival date & time 10/25/14  1759 History   First MD Initiated Contact with Patient 10/25/14 1921     Chief Complaint  Patient presents with  . Rash     (Consider location/radiation/quality/duration/timing/severity/associated sxs/prior Treatment) Patient is a 53 y.o. female presenting with rash.  Rash Location:  Face Facial rash location:  R eyebrow, R eyelid and R cheek Quality: dryness, itchiness, painful and redness   Pain details:    Quality:  Itching, sharp and burning   Severity:  Mild   Onset quality:  Gradual   Duration:  2 days   Timing:  Constant   Progression:  Worsening Severity:  Moderate Timing:  Constant Progression:  Worsening Chronicity:  New Context: not animal contact and not nuts   Associated symptoms: no abdominal pain, no fever, no headaches and no shortness of breath     Past Medical History  Diagnosis Date  . Dysrhythmia Sept 2012    A-Fib  . Cardiomegaly sept 2012  . Marfan syndrome    Past Surgical History  Procedure Laterality Date  . Abdominal hysterectomy  2009    complete   . Cesarean section  1994    emergency  . Hernia repair  1965    double hernia repair   History reviewed. No pertinent family history. History  Substance Use Topics  . Smoking status: Never Smoker   . Smokeless tobacco: Not on file  . Alcohol Use: No   OB History    No data available     Review of Systems  Constitutional: Negative for fever and activity change.  HENT: Negative for congestion and facial swelling.   Eyes: Negative for discharge and redness.  Respiratory: Negative for cough and shortness of breath.   Cardiovascular: Negative for chest pain and palpitations.  Gastrointestinal: Negative for abdominal pain and abdominal distention.  Endocrine: Negative for polydipsia and polyuria.  Genitourinary: Negative for dysuria and menstrual problem.  Musculoskeletal: Negative for back pain and joint swelling.  Skin: Positive for  rash. Negative for color change and wound.  Neurological: Negative for dizziness, light-headedness and headaches.      Allergies  Aspirin  Home Medications   Prior to Admission medications   Medication Sig Start Date End Date Taking? Authorizing Provider  acetaminophen (TYLENOL) 500 MG tablet Take 500 mg by mouth every 8 (eight) hours as needed for pain.    Historical Provider, MD  ALPRAZolam Prudy Feeler(XANAX) 0.25 MG tablet Take 0.25 mg by mouth at bedtime as needed for sleep. For anxiety.    Historical Provider, MD  amiodarone (PACERONE) 200 MG tablet Take 200 mg by mouth every morning.     Historical Provider, MD  chlorpheniramine-HYDROcodone (TUSSIONEX) 10-8 MG/5ML LQCR Take 5 mLs by mouth every 12 (twelve) hours as needed for cough. 09/25/13   Ricki RodriguezAjay S Kadakia, MD  clindamycin (CLEOCIN) 300 MG capsule Take 1 capsule (300 mg total) by mouth 3 (three) times daily. 11/02/13   Linna HoffJames D Kindl, MD  estradiol (ESTRACE) 2 MG tablet Take 2 mg by mouth every evening.     Historical Provider, MD  fexofenadine (ALLEGRA) 180 MG tablet Take 180 mg by mouth every evening.    Historical Provider, MD  guaiFENesin (MUCINEX) 600 MG 12 hr tablet Take 1 tablet (600 mg total) by mouth 2 (two) times daily. 09/25/13   Ricki RodriguezAjay S Kadakia, MD  HYDROcodone-acetaminophen (NORCO/VICODIN) 5-325 MG per tablet Take 1-2 tablets by mouth every 6 (six) hours as needed for  moderate pain or severe pain. 10/25/14   Marily MemosJason Jennene Downie, MD  metoprolol (LOPRESSOR) 50 MG tablet Take 1.5 tablets (75 mg total) by mouth 2 (two) times daily. 09/25/13   Ricki RodriguezAjay S Kadakia, MD  Tetrahydroz-Dextran-PEG-Povid (EYE DROPS ADVANCED RELIEF) 0.05-0.1-1-1 % SOLN Apply 2 drops to eye every hour as needed (dryness or itching). 10/25/14   Marily MemosJason Risha Barretta, MD  trimethoprim-polymyxin b (POLYTRIM) ophthalmic solution Place 1 drop into the right eye every 3 (three) hours. While awake, up to 6 doses per day 08/16/14   Graylon GoodZachary H Baker, PA-C  valACYclovir (VALTREX) 1000 MG tablet Take 1  tablet (1,000 mg total) by mouth 3 (three) times daily. 10/25/14 11/08/14  Marily MemosJason Ali Mohl, MD  warfarin (COUMADIN) 3 MG tablet Take 1 tablet (3 mg total) by mouth every evening. Take 1 tablet (3 mg) by mouth every evening except none on Sunday 09/25/13   Ricki RodriguezAjay S Kadakia, MD   BP 145/97 mmHg  Pulse 77  Temp(Src) 97.6 F (36.4 C) (Oral)  Resp 18  SpO2 98% Physical Exam  Constitutional: She is oriented to person, place, and time. She appears well-developed and well-nourished.  HENT:  Head: Normocephalic and atraumatic.    Eyes: EOM are normal. Right eye exhibits no discharge. Left eye exhibits no discharge. Right conjunctiva is not injected. Right conjunctiva has no hemorrhage. Left conjunctiva is injected. Left conjunctiva has no hemorrhage. Right eye exhibits normal extraocular motion. Left eye exhibits normal extraocular motion. Right pupil is reactive. Left pupil is reactive.  Woods Lamp without fluoroscein uptake. IOP 16 in left eye, 15 in right eye.  Cardiovascular: Normal rate and regular rhythm.   Pulmonary/Chest: Effort normal and breath sounds normal. No respiratory distress.  Abdominal: Soft. She exhibits no distension. There is no tenderness. There is no rebound.  Musculoskeletal: Normal range of motion. She exhibits no edema or tenderness.  Neurological: She is alert and oriented to person, place, and time.  Skin: Skin is warm and dry.  Nursing note and vitals reviewed.   ED Course  Procedures (including critical care time) Labs Review Labs Reviewed - No data to display  Imaging Review [place] Soft Tissue Head/neck  10/24/2014   CLINICAL DATA:  Hyperthyroidism.  History of nodules.  EXAM: THYROID ULTRASOUND  TECHNIQUE: Ultrasound examination of the thyroid gland and adjacent soft tissues was performed.  COMPARISON:  03/19/2014  FINDINGS: Right thyroid lobe  Measurements: 5.4 x 1.8 x 2.1 cm. 1.6 x 0.9 x 1.3 cm complex upper pole nodule previously measured 1.5 x 1.0 x 1.2 cm. 1.8 x 1.1  x 1.5 cm predominantly cystic lower pole complex nodule previously measured 1.3 x 0.9 x 1.2 cm.  Left thyroid lobe  Measurements: 4.8 x 1.2 x 1.5 cm. 3 mm complex nodule in the interpolar region.  Isthmus  Thickness: 7 mm.  No nodules visualized.  Lymphadenopathy  None visualized.  IMPRESSION: There are 2 complex nodules in the right lobe with maximal diameters of 1.6 cm in the upper pole and 1.8 cm in the lower pole. They have both slightly enlarged since the prior study. Findings meet consensus criteria for biopsy. Ultrasound-guided fine needle aspiration should be considered, as per the consensus statement: Management of Thyroid Nodules Detected at US: Society of Radiologists in Ultrasound Consensus Conference Statement. Radiology 2005; [id number].   Electronically Signed   By: Maryclare BeanArt  Hoss M.D.   On: 10/24/2014 12:50     EKG Interpretation None      MDM   Final diagnoses:  Shingles  53 year old female with 2 days of worsening rash around her left eye with some eyelid droop eye pain no change in vision. Has a history of chickenpox. No fevers but is completing a prednisone's taper. Exam as above. Concern is for likely shingles. 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 We will treat her with acyclovir for 2 weeks have her follow-up with her PCP and optometrist.    Marily Memos, MD 10/25/14 2016  Purvis Sheffield, MD 10/26/14 1201

## 2014-10-25 NOTE — ED Notes (Signed)
Pt reports rash to scalp and face that started on Thursday. Rash is itching but is causing pain to left eye. Also unsure if its related to recent changes in medications, pt has extensive medical history. No acute distress noted at triage.

## 2014-10-25 NOTE — ED Notes (Signed)
Pt A&OX4, ambulatory at d/c with steady gait, NAD 

## 2014-10-31 ENCOUNTER — Ambulatory Visit (HOSPITAL_COMMUNITY): Payer: Medicaid Other

## 2014-12-05 ENCOUNTER — Emergency Department (HOSPITAL_COMMUNITY)
Admission: EM | Admit: 2014-12-05 | Discharge: 2014-12-05 | Disposition: A | Discharge status: Home / Self Care | Payer: Medicaid Other | Attending: Emergency Medicine | Admitting: Emergency Medicine

## 2014-12-05 ENCOUNTER — Encounter (HOSPITAL_COMMUNITY): Payer: Self-pay | Admitting: Nurse Practitioner

## 2014-12-05 DIAGNOSIS — Q8789 Other specified congenital malformation syndromes, not elsewhere classified: Secondary | ICD-10-CM | POA: Diagnosis not present

## 2014-12-05 DIAGNOSIS — W010XXA Fall on same level from slipping, tripping and stumbling without subsequent striking against object, initial encounter: Secondary | ICD-10-CM | POA: Diagnosis not present

## 2014-12-05 DIAGNOSIS — S0990XA Unspecified injury of head, initial encounter: Secondary | ICD-10-CM | POA: Diagnosis not present

## 2014-12-05 DIAGNOSIS — Y9389 Activity, other specified: Secondary | ICD-10-CM | POA: Insufficient documentation

## 2014-12-05 DIAGNOSIS — Y9289 Other specified places as the place of occurrence of the external cause: Secondary | ICD-10-CM | POA: Insufficient documentation

## 2014-12-05 DIAGNOSIS — Z79899 Other long term (current) drug therapy: Secondary | ICD-10-CM | POA: Insufficient documentation

## 2014-12-05 DIAGNOSIS — Z8619 Personal history of other infectious and parasitic diseases: Secondary | ICD-10-CM | POA: Diagnosis not present

## 2014-12-05 DIAGNOSIS — Y998 Other external cause status: Secondary | ICD-10-CM | POA: Diagnosis not present

## 2014-12-05 DIAGNOSIS — Z7901 Long term (current) use of anticoagulants: Secondary | ICD-10-CM | POA: Diagnosis not present

## 2014-12-05 DIAGNOSIS — Z792 Long term (current) use of antibiotics: Secondary | ICD-10-CM | POA: Insufficient documentation

## 2014-12-05 DIAGNOSIS — S8011XA Contusion of right lower leg, initial encounter: Secondary | ICD-10-CM | POA: Insufficient documentation

## 2014-12-05 DIAGNOSIS — Z8639 Personal history of other endocrine, nutritional and metabolic disease: Secondary | ICD-10-CM | POA: Diagnosis not present

## 2014-12-05 DIAGNOSIS — S4991XA Unspecified injury of right shoulder and upper arm, initial encounter: Secondary | ICD-10-CM | POA: Insufficient documentation

## 2014-12-05 DIAGNOSIS — I4891 Unspecified atrial fibrillation: Secondary | ICD-10-CM | POA: Insufficient documentation

## 2014-12-05 DIAGNOSIS — S8991XA Unspecified injury of right lower leg, initial encounter: Secondary | ICD-10-CM | POA: Diagnosis present

## 2014-12-05 DIAGNOSIS — T148XXA Other injury of unspecified body region, initial encounter: Secondary | ICD-10-CM

## 2014-12-05 HISTORY — DX: Disorder of thyroid, unspecified: E07.9

## 2014-12-05 HISTORY — DX: Unspecified atrial fibrillation: I48.91

## 2014-12-05 HISTORY — DX: Zoster without complications: B02.9

## 2014-12-05 HISTORY — DX: Other specified congenital malformation syndromes, not elsewhere classified: Q87.89

## 2014-12-05 LAB — PROTIME-INR
INR: 2.43 — ABNORMAL HIGH (ref 0.00–1.49)
Prothrombin Time: 26.6 seconds — ABNORMAL HIGH (ref 11.6–15.2)

## 2014-12-05 NOTE — ED Notes (Signed)
[facility]- Ortho at bedside.

## 2014-12-05 NOTE — Discharge Instructions (Signed)
Contusion °A contusion is a deep bruise. Contusions happen when an injury causes bleeding under the skin. Signs of bruising include pain, puffiness (swelling), and discolored skin. The contusion may turn blue, purple, or yellow. °HOME CARE  °· Put ice on the injured area. °¨ Put ice in a plastic bag. °¨ Place a towel between your skin and the bag. °¨ Leave the ice on for 15-20 minutes, 03-04 times a day. °· Only take medicine as told by your doctor. °· Rest the injured area. °· If possible, raise (elevate) the injured area to lessen puffiness. °GET HELP RIGHT AWAY IF:  °· You have more bruising or puffiness. °· You have pain that is getting worse. °· Your puffiness or pain is not helped by medicine. °MAKE SURE YOU:  °· Understand these instructions. °· Will watch your condition. °· Will get help right away if you are not doing well or get worse. °Document Released: 04/25/2008 Document Revised: 01/30/2012 Document Reviewed: 09/12/2011 °[employer]® Patient Information ©2015 [employer]. This information is not intended to replace advice given to you by your health care provider. Make sure you discuss any questions you have with your health care provider. ° °Hematoma °A hematoma is a collection of blood. The collection of blood can turn into a hard, painful lump under the skin. Your skin may turn blue or yellow if the hematoma is close to the surface of the skin. Most hematomas get better in a few days to weeks. Some hematomas are serious and need medical care. Hematomas can be very small or very big. °HOME CARE °· Apply ice to the injured area: °¨ Put ice in a plastic bag. °¨ Place a towel between your skin and the bag. °¨ Leave the ice on for 20 minutes, 2-3 times a day for the first 1 to 2 days. °· After the first 2 days, switch to using warm packs on the injured area. °· Raise (elevate) the injured area to lessen pain and puffiness (swelling). You may also wrap the area with an elastic bandage. Make sure the  bandage is not wrapped too tight. °· If you have a painful hematoma on your leg or foot, you may use crutches for a couple days. °· Only take medicines as told by your doctor. °GET HELP RIGHT AWAY IF:  °· Your pain gets worse. °· Your pain is not controlled with medicine. °· You have a fever. °· Your puffiness gets worse. °· Your skin turns more blue or yellow. °· Your skin over the hematoma breaks or starts bleeding. °· Your hematoma is in your chest or belly (abdomen) and you are short of breath, feel weak, or have a change in consciousness. °· Your hematoma is on your scalp and you have a headache that gets worse or a change in alertness or consciousness. °MAKE SURE YOU:  °· Understand these instructions. °· Will watch your condition. °· Will get help right away if you are not doing well or get worse. °Document Released: 12/15/2004 Document Revised: 07/10/2013 Document Reviewed: 04/17/2013 °[employer]® Patient Information ©2015 [employer]. This information is not intended to replace advice given to you by your health care provider. Make sure you discuss any questions you have with your health care provider. ° °

## 2014-12-05 NOTE — ED Notes (Signed)
Dr Beaton at bedside 

## 2014-12-05 NOTE — Progress Notes (Signed)
Orthopedic Tech Progress Note Patient Details:  Vonda AntiguaHelene Bensch Jul 01, 1961 [id number]  Ortho Devices Type of Ortho Device: Knee Sleeve Ortho Device/Splint Location: rle Ortho Device/Splint Interventions: Application   Tayana Shankle 12/05/2014, 10:34 AM

## 2014-12-05 NOTE — ED Provider Notes (Signed)
CSN: [id number]     Arrival date & time 12/05/14  4098 History   First MD Initiated Contact with Patient 12/05/14 1002     Chief Complaint  Patient presents with  . Fall  . Leg Pain      HPI Per pt sts she fell 1 week ago. sts trip and fall. sts injured right leg, hit head, right shoulder. sts she was seen at [facility] and had xrays and normal. sts she is worried about her leg because significant bruising and pain. Pt is on coumadin. sts she also hit her head but denies LOC or headache Past Medical History  Diagnosis Date  . Cardiomegaly   . Atrial fibrillation   . Thyroid disease     hyperthyroidism  . Shingles   . Loeys-Dietz syndrome     type 1   Past Surgical History  Procedure Laterality Date  . Abdominal hysterectomy  2009    complete   . Cesarean section  1994    emergency  . Hernia repair  1965    double hernia repair   No family history on file. History  Substance Use Topics  . Smoking status: Never Smoker   . Smokeless tobacco: Not on file  . Alcohol Use: No   OB History    No data available     Review of Systems  All other systems reviewed and are negative  Allergies  Aspirin  Home Medications   Prior to Admission medications   Medication Sig Start Date End Date Taking? Authorizing Provider  acetaminophen (TYLENOL) 500 MG tablet Take 500 mg by mouth every 8 (eight) hours as needed for pain.    Historical Provider, MD  ALPRAZolam Prudy Feeler) 0.25 MG tablet Take 0.25 mg by mouth at bedtime as needed for sleep. For anxiety.    Historical Provider, MD  amiodarone (PACERONE) 200 MG tablet Take 200 mg by mouth every morning.     Historical Provider, MD  chlorpheniramine-HYDROcodone (TUSSIONEX) 10-8 MG/5ML LQCR Take 5 mLs by mouth every 12 (twelve) hours as needed for cough. 09/25/13   Ricki Rodriguez, MD  clindamycin (CLEOCIN) 300 MG capsule Take 1 capsule (300 mg total) by mouth 3 (three) times daily. 11/02/13   Linna Hoff, MD  estradiol (ESTRACE) 2 MG tablet  Take 2 mg by mouth every evening.     Historical Provider, MD  fexofenadine (ALLEGRA) 180 MG tablet Take 180 mg by mouth every evening.    Historical Provider, MD  guaiFENesin (MUCINEX) 600 MG 12 hr tablet Take 1 tablet (600 mg total) by mouth 2 (two) times daily. 09/25/13   Ricki Rodriguez, MD  HYDROcodone-acetaminophen (NORCO/VICODIN) 5-325 MG per tablet Take 1-2 tablets by mouth every 6 (six) hours as needed for moderate pain or severe pain. 10/25/14   Marily Memos, MD  metoprolol (LOPRESSOR) 50 MG tablet Take 1.5 tablets (75 mg total) by mouth 2 (two) times daily. 09/25/13   Ricki Rodriguez, MD  Tetrahydroz-Dextran-PEG-Povid (EYE DROPS ADVANCED RELIEF) 0.05-0.1-1-1 % SOLN Apply 2 drops to eye every hour as needed (dryness or itching). 10/25/14   Marily Memos, MD  trimethoprim-polymyxin b (POLYTRIM) ophthalmic solution Place 1 drop into the right eye every 3 (three) hours. While awake, up to 6 doses per day 08/16/14   Graylon Good, PA-C  warfarin (COUMADIN) 3 MG tablet Take 1 tablet (3 mg total) by mouth every evening. Take 1 tablet (3 mg) by mouth every evening except none on Sunday 09/25/13   Ajay  Roseanne KaufmanS Kadakia, MD   BP 116/75 mmHg  Pulse 79  Temp(Src) 97.8 F (36.6 C) (Oral)  Resp 20  SpO2 97% Physical Exam  Constitutional: She is oriented to person, place, and time. She appears well-developed and well-nourished. No distress.  HENT:  Head: Normocephalic and atraumatic.  Eyes: Pupils are equal, round, and reactive to light.  Neck: Normal range of motion.  Cardiovascular: Normal rate and intact distal pulses.   Pulmonary/Chest: No respiratory distress.  Abdominal: Normal appearance. She exhibits no distension.  Musculoskeletal:       Legs: Neurological: She is alert and oriented to person, place, and time. No cranial nerve deficit.  Skin: Skin is warm and dry. No rash noted.  Psychiatric: She has a normal mood and affect. Her behavior is normal.  Nursing note and vitals reviewed.   ED  Course  Procedures (including critical care time)  Review of [facility] after fall was negative for any acute fractures. Labs Review Labs Reviewed  PROTIME-INR - Abnormal; Notable for the following:    Prothrombin Time 26.6 (*)    INR 2.43 (*)    All other components within normal limits    Imaging Review No results found.   Discussed the patient plans follow-up.  At this time plan on wearing support for the knee until the contusion has resolved over the next 2-3 weeks.  At that point time if she continues to have weakness in the knee she can evaluate by an orthopedic doctor for further evaluation of ligamentous injury. MDM   Final diagnoses:  Contusion, lower leg, right, initial encounter  Minor abrasion  Anticoagulated        Nelia Shiobert L Bishoy Cupp, MD 12/06/14 [phone]

## 2014-12-05 NOTE — ED Notes (Signed)
Per pt sts she fell 1 week ago. sts trip and fall. sts injured right leg, hit head, right shoulder. sts [facility] and [facility] and normal. sts she is worried about her leg because significant bruising and pain. Pt is on coumadin. sts she also hit her head but denies LOC.

## 2015-01-13 ENCOUNTER — Ambulatory Visit: Payer: Self-pay

## 2015-07-17 ENCOUNTER — Other Ambulatory Visit: Payer: Self-pay

## 2015-07-17 ENCOUNTER — Encounter: Payer: Self-pay | Admitting: Emergency Medicine

## 2015-07-17 ENCOUNTER — Emergency Department
Admission: EM | Admit: 2015-07-17 | Discharge: 2015-07-17 | Disposition: A | Discharge status: Home / Self Care | Payer: Medicaid Other | Attending: Emergency Medicine | Admitting: Emergency Medicine

## 2015-07-17 DIAGNOSIS — Z792 Long term (current) use of antibiotics: Secondary | ICD-10-CM | POA: Diagnosis not present

## 2015-07-17 DIAGNOSIS — I482 Chronic atrial fibrillation, unspecified: Secondary | ICD-10-CM

## 2015-07-17 DIAGNOSIS — Z79899 Other long term (current) drug therapy: Secondary | ICD-10-CM | POA: Diagnosis not present

## 2015-07-17 DIAGNOSIS — E860 Dehydration: Secondary | ICD-10-CM | POA: Diagnosis not present

## 2015-07-17 DIAGNOSIS — R002 Palpitations: Secondary | ICD-10-CM | POA: Diagnosis present

## 2015-07-17 LAB — CBC WITH DIFFERENTIAL/PLATELET
BASOS ABS: 0 10*3/uL (ref 0–0.1)
BASOS PCT: 1 %
EOS ABS: 0 10*3/uL (ref 0–0.7)
EOS PCT: 1 %
HCT: 40.5 % (ref 35.0–47.0)
Hemoglobin: 13.2 g/dL (ref 12.0–16.0)
Lymphocytes Relative: 16 %
Lymphs Abs: 1.2 10*3/uL (ref 1.0–3.6)
MCH: 29.7 pg (ref 26.0–34.0)
MCHC: 32.6 g/dL (ref 32.0–36.0)
MCV: 91.2 fL (ref 80.0–100.0)
MONO ABS: 0.4 10*3/uL (ref 0.2–0.9)
Monocytes Relative: 5 %
Neutro Abs: 5.8 10*3/uL (ref 1.4–6.5)
Neutrophils Relative %: 77 %
Platelets: 269 10*3/uL (ref 150–440)
RBC: 4.44 MIL/uL (ref 3.80–5.20)
RDW: 14.5 % (ref 11.5–14.5)
WBC: 7.5 10*3/uL (ref 3.6–11.0)

## 2015-07-17 LAB — MAGNESIUM: Magnesium: 1.7 mg/dL (ref 1.7–2.4)

## 2015-07-17 LAB — COMPREHENSIVE METABOLIC PANEL
ALBUMIN: 4 g/dL (ref 3.5–5.0)
ALT: 12 U/L — ABNORMAL LOW (ref 14–54)
AST: 17 U/L (ref 15–41)
Alkaline Phosphatase: 91 U/L (ref 38–126)
Anion gap: 8 (ref 5–15)
BUN: 15 mg/dL (ref 6–20)
CO2: 25 mmol/L (ref 22–32)
Calcium: 9.3 mg/dL (ref 8.9–10.3)
Chloride: 107 mmol/L (ref 101–111)
Creatinine, Ser: 1.04 mg/dL — ABNORMAL HIGH (ref 0.44–1.00)
GFR calc Af Amer: >60 mL/min (ref >60)
Glucose, Bld: 109 mg/dL — ABNORMAL HIGH (ref 65–99)
POTASSIUM: 4.4 mmol/L (ref 3.5–5.1)
SODIUM: 140 mmol/L (ref 135–145)
Total Bilirubin: 0.2 mg/dL — ABNORMAL LOW (ref 0.3–1.2)
Total Protein: 7 g/dL (ref 6.5–8.1)

## 2015-07-17 LAB — TROPONIN I

## 2015-07-17 MED ORDER — SODIUM CHLORIDE 0.9 % IV BOLUS (SEPSIS)
1000.0000 mL | Freq: Once | INTRAVENOUS | Status: AC
  Administered 2015-07-17: 1000 mL via INTRAVENOUS

## 2015-07-17 NOTE — ED Notes (Signed)
Pt reports palpitations onset today.  Skin w/d, a&o

## 2015-07-17 NOTE — Discharge Instructions (Signed)
Dehydration, Adult Dehydration is when you lose more fluids from the body than you take in. Vital organs like the kidneys, brain, and heart cannot function without a proper amount of fluids and salt. Any loss of fluids from the body can cause dehydration.  CAUSES   Vomiting.  Diarrhea.  Excessive sweating.  Excessive urine output.  Fever. SYMPTOMS  Mild dehydration  Thirst.  Dry lips.  Slightly dry mouth. Moderate dehydration  Very dry mouth.  Sunken eyes.  Skin does not bounce back quickly when lightly pinched and released.  Dark urine and decreased urine production.  Decreased tear production.  Headache. Severe dehydration  Very dry mouth.  Extreme thirst.  Rapid, weak pulse (more than 100 beats per minute at rest).  Cold hands and feet.  Not able to sweat in spite of heat and temperature.  Rapid breathing.  Blue lips.  Confusion and lethargy.  Difficulty being awakened.  Minimal urine production.  No tears. DIAGNOSIS  Your caregiver will diagnose dehydration based on your symptoms and your exam. Blood and urine tests will help confirm the diagnosis. The diagnostic evaluation should also identify the cause of dehydration. TREATMENT  Treatment of mild or moderate dehydration can often be done at home by increasing the amount of fluids that you drink. It is best to drink small amounts of fluid more often. Drinking too much at one time can make vomiting worse. Refer to the home care instructions below. Severe dehydration needs to be treated at the hospital where you will probably be given intravenous (IV) fluids that contain water and electrolytes. HOME CARE INSTRUCTIONS   Ask your caregiver about specific rehydration instructions.  Drink enough fluids to keep your urine clear or pale yellow.  Drink small amounts frequently if you have nausea and vomiting.  Eat as you normally do.  Avoid:  Foods or drinks high in sugar.  Carbonated  drinks.  Juice.  Extremely hot or cold fluids.  Drinks with caffeine.  Fatty, greasy foods.  Alcohol.  Tobacco.  Overeating.  Gelatin desserts.  Wash your hands well to avoid spreading bacteria and viruses.  Only take over-the-counter or prescription medicines for pain, discomfort, or fever as directed by your caregiver.  Ask your caregiver if you should continue all prescribed and over-the-counter medicines.  Keep all follow-up appointments with your caregiver. SEEK MEDICAL CARE IF:  You have abdominal pain and it increases or stays in one area (localizes).  You have a rash, stiff neck, or severe headache.  You are irritable, sleepy, or difficult to awaken.  You are weak, dizzy, or extremely thirsty. SEEK IMMEDIATE MEDICAL CARE IF:   You are unable to keep fluids down or you get worse despite treatment.  You have frequent episodes of vomiting or diarrhea.  You have blood or green matter (bile) in your vomit.  You have blood in your stool or your stool looks black and tarry.  You have not urinated in 6 to 8 hours, or you have only urinated a small amount of very dark urine.  You have a fever.  You faint. MAKE SURE YOU:   Understand these instructions.  Will watch your condition.  Will get help right away if you are not doing well or get worse. Document Released: 11/07/2005 Document Revised: 01/30/2012 Document Reviewed: 06/27/2011 Exit[employer] Patient Information 2015 [employer]. This information is not intended to replace advice given to you by your health care provider. Make sure you discuss any questions you have with your health care   provider.  

## 2015-07-17 NOTE — ED Provider Notes (Signed)
[facility] Emergency Department Provider Note  ____________________________________________  Time seen: 4:15 PM  I have reviewed the triage vital signs and the nursing notes.   HISTORY  Chief Complaint Palpitations    HPI Brittany Newton is a 54 y.o. female who complains of palpitations as well as feeling flushed and dehydrated today. She notes that she has a connective tissue disorder as well as chronic atrial fibrillation and gets dehydrated and tachycardic easily. Over last couple days she has been outside a lot and been out of the house with decreased fluid intake as a result. She denies any chest pain shortness of breath dizziness syncope fever chills nausea vomiting diarrhea or abdominal pain. She otherwise feels fine other than the palpitations.     Past Medical History  Diagnosis Date  . Cardiomegaly   . Atrial fibrillation   . Thyroid disease     hyperthyroidism  . Shingles   . Loeys-Dietz syndrome     type 1    There are no active problems to display for this patient.   Past Surgical History  Procedure Laterality Date  . Abdominal hysterectomy  2009    complete   . Cesarean section  1994    emergency  . Hernia repair  1965    double hernia repair    Current Outpatient Rx  Name  Route  Sig  Dispense  Refill  . acetaminophen (TYLENOL) 500 MG tablet   Oral   Take 500 mg by mouth every 8 (eight) hours as needed for pain.         Marland Kitchen ALPRAZolam (XANAX) 0.25 MG tablet   Oral   Take 0.25 mg by mouth at bedtime as needed for sleep. For anxiety.         Marland Kitchen amiodarone (PACERONE) 200 MG tablet   Oral   Take 200 mg by mouth every morning.          . chlorpheniramine-HYDROcodone (TUSSIONEX) 10-8 MG/5ML LQCR   Oral   Take 5 mLs by mouth every 12 (twelve) hours as needed for cough.   120 mL   0   . clindamycin (CLEOCIN) 300 MG capsule   Oral   Take 1 capsule (300 mg total) by mouth 3 (three) times daily.   30 capsule   0   .  estradiol (ESTRACE) 2 MG tablet   Oral   Take 2 mg by mouth every evening.          . fexofenadine (ALLEGRA) 180 MG tablet   Oral   Take 180 mg by mouth every evening.         Marland Kitchen guaiFENesin (MUCINEX) 600 MG 12 hr tablet   Oral   Take 1 tablet (600 mg total) by mouth 2 (two) times daily.   10 tablet   0   . HYDROcodone-acetaminophen (NORCO/VICODIN) 5-325 MG per tablet   Oral   Take 1-2 tablets by mouth every 6 (six) hours as needed for moderate pain or severe pain.   20 tablet   0   . metoprolol (LOPRESSOR) 50 MG tablet   Oral   Take 1.5 tablets (75 mg total) by mouth 2 (two) times daily.   90 tablet   3   . Tetrahydroz-Dextran-PEG-Povid (EYE DROPS ADVANCED RELIEF) 0.05-0.1-1-1 % SOLN   Ophthalmic   Apply 2 drops to eye every hour as needed (dryness or itching).   1 Bottle   0   . trimethoprim-polymyxin b (POLYTRIM) ophthalmic solution   Right Eye  Place 1 drop into the right eye every 3 (three) hours. While awake, up to 6 doses per day   10 mL   0   . warfarin (COUMADIN) 3 MG tablet   Oral   Take 1 tablet (3 mg total) by mouth every evening. Take 1 tablet (3 mg) by mouth every evening except none on Sunday           Allergies Aspirin  History reviewed. No pertinent family history.  Social History Social History  Substance Use Topics  . Smoking status: Never Smoker   . Smokeless tobacco: None  . Alcohol Use: No    Review of Systems  Constitutional: No fever or chills. No weight changes Eyes:No blurry vision or double vision.  ENT: No sore throat. Cardiovascular: No chest pain. Respiratory: No dyspnea or cough. Gastrointestinal: Negative for abdominal pain, vomiting and diarrhea.  No BRBPR or melena. Genitourinary: Negative for dysuria, urinary retention, bloody urine, or difficulty urinating. Musculoskeletal: Negative for back pain. No joint swelling or pain. Skin: Negative for rash. Neurological: Negative for headaches, focal weakness or  numbness. Psychiatric:No anxiety or depression.   Endocrine:No hot/cold intolerance, changes in energy, or sleep difficulty.  10-point ROS otherwise negative.  ____________________________________________   PHYSICAL EXAM:  VITAL SIGNS: ED Triage Vitals  Enc Vitals Group     BP 07/17/15 1552 138/65 mmHg     Pulse Rate 07/17/15 1552 88     Resp 07/17/15 1630 18     Temp 07/17/15 1552 98.1 F (36.7 C)     Temp Source 07/17/15 1552 Oral     SpO2 07/17/15 1552 99 %     Weight 07/17/15 1552 240 lb (108.863 kg)     Height 07/17/15 1552 6\' 2"  (1.88 m)     Head Cir --      Peak Flow --      Pain Score 07/17/15 1557 0     Pain Loc --      Pain Edu? --      Excl. in GC? --      Constitutional: Alert and oriented. Well appearing and in no distress. Calm and comfortable and smiling Eyes: No scleral icterus. No conjunctival pallor. PERRL. EOMI ENT   Head: Normocephalic and atraumatic.   Nose: No congestion/rhinnorhea. No septal hematoma   Mouth/Throat: Dry mucous membranes, no pharyngeal erythema. No peritonsillar mass. No uvula shift.   Neck: No stridor. No SubQ emphysema. No meningismus. Hematological/Lymphatic/Immunilogical: No cervical lymphadenopathy. Cardiovascular: Irregularly irregular rhythm with a rate 130-140. Normal and symmetric distal pulses are present in all extremities. No murmurs, rubs, or gallops. Respiratory: Normal respiratory effort without tachypnea nor retractions. Breath sounds are clear and equal bilaterally. No wheezes/rales/rhonchi. Gastrointestinal: Soft and nontender. No distention. There is no CVA tenderness.  No rebound, rigidity, or guarding. Genitourinary: deferred Musculoskeletal: Nontender with normal range of motion in all extremities. No joint effusions.  No lower extremity tenderness.  No edema. Neurologic:   Normal speech and language.  CN 2-10 normal. Motor grossly intact. No pronator drift.  Normal gait. No gross focal  neurologic deficits are appreciated.  Skin:  Skin is warm, dry and intact. No rash noted.  No petechiae, purpura, or bullae. Psychiatric: Mood and affect are normal. Speech and behavior are normal. Patient exhibits appropriate insight and judgment.  ____________________________________________    LABS (pertinent positives/negatives) (all labs ordered are listed, but only abnormal results are displayed) Labs Reviewed  COMPREHENSIVE METABOLIC PANEL - Abnormal; Notable for the following:  Glucose, Bld 109 (*)    Creatinine, Ser 1.04 (*)    ALT 12 (*)    Total Bilirubin 0.2 (*)    All other components within normal limits  CBC WITH DIFFERENTIAL/PLATELET  TROPONIN I  MAGNESIUM   ____________________________________________   EKG  Interpreted by me Atrial fibrillation with a rate of 142. Normal axis intervals QRS and ST segments and T waves. There is one PVC on the EKG  ____________________________________________    RADIOLOGY    ____________________________________________   PROCEDURES  ____________________________________________   INITIAL IMPRESSION / ASSESSMENT AND PLAN / ED COURSE  Pertinent labs & imaging results that were available during my care of the patient were reviewed by me and considered in my medical decision making (see chart for details).  Patient presents with tachycardia which appears to be related to mild dehydration. Labs are unremarkable, patient is in no acute distress and very calm and comfortable and very very well-appearing. She is given 2 L of IV fluids with rapid improvement of her tachycardia to normal vital signs. She requested I discussed the case with her cardiologist Dr. Georga Hacking Ward at [facility], and I did receive a call back from cardiology Dr. Jacqlyn Krauss who was covering at this time who does not note any additional concerns or recommendations at this time. He does advise that the patient will be able to follow up with Dr. Elesa Massed soon if  desired.  At 7 PM the patient's is again very well-appearing and I have very low suspicion for any acute cardiopulmonary pathology. Her dehydration appears resolved and she looks great. We'll discharge home.  ____________________________________________   FINAL CLINICAL IMPRESSION(S) / ED DIAGNOSES  Final diagnoses:  Dehydration  Chronic atrial fibrillation      Sharman Cheek, MD 07/17/15 1920

## 2015-10-02 IMAGING — CR DG CHEST 2V
2 series · 2 of 2 positions shown · non-contrast
Comparison: 05/21/2012

CLINICAL DATA: Cough. Fever. Sinus infection. Rule out pneumonia.

EXAM:
CHEST  2 VIEW

[view not recorded (1 of 2)]
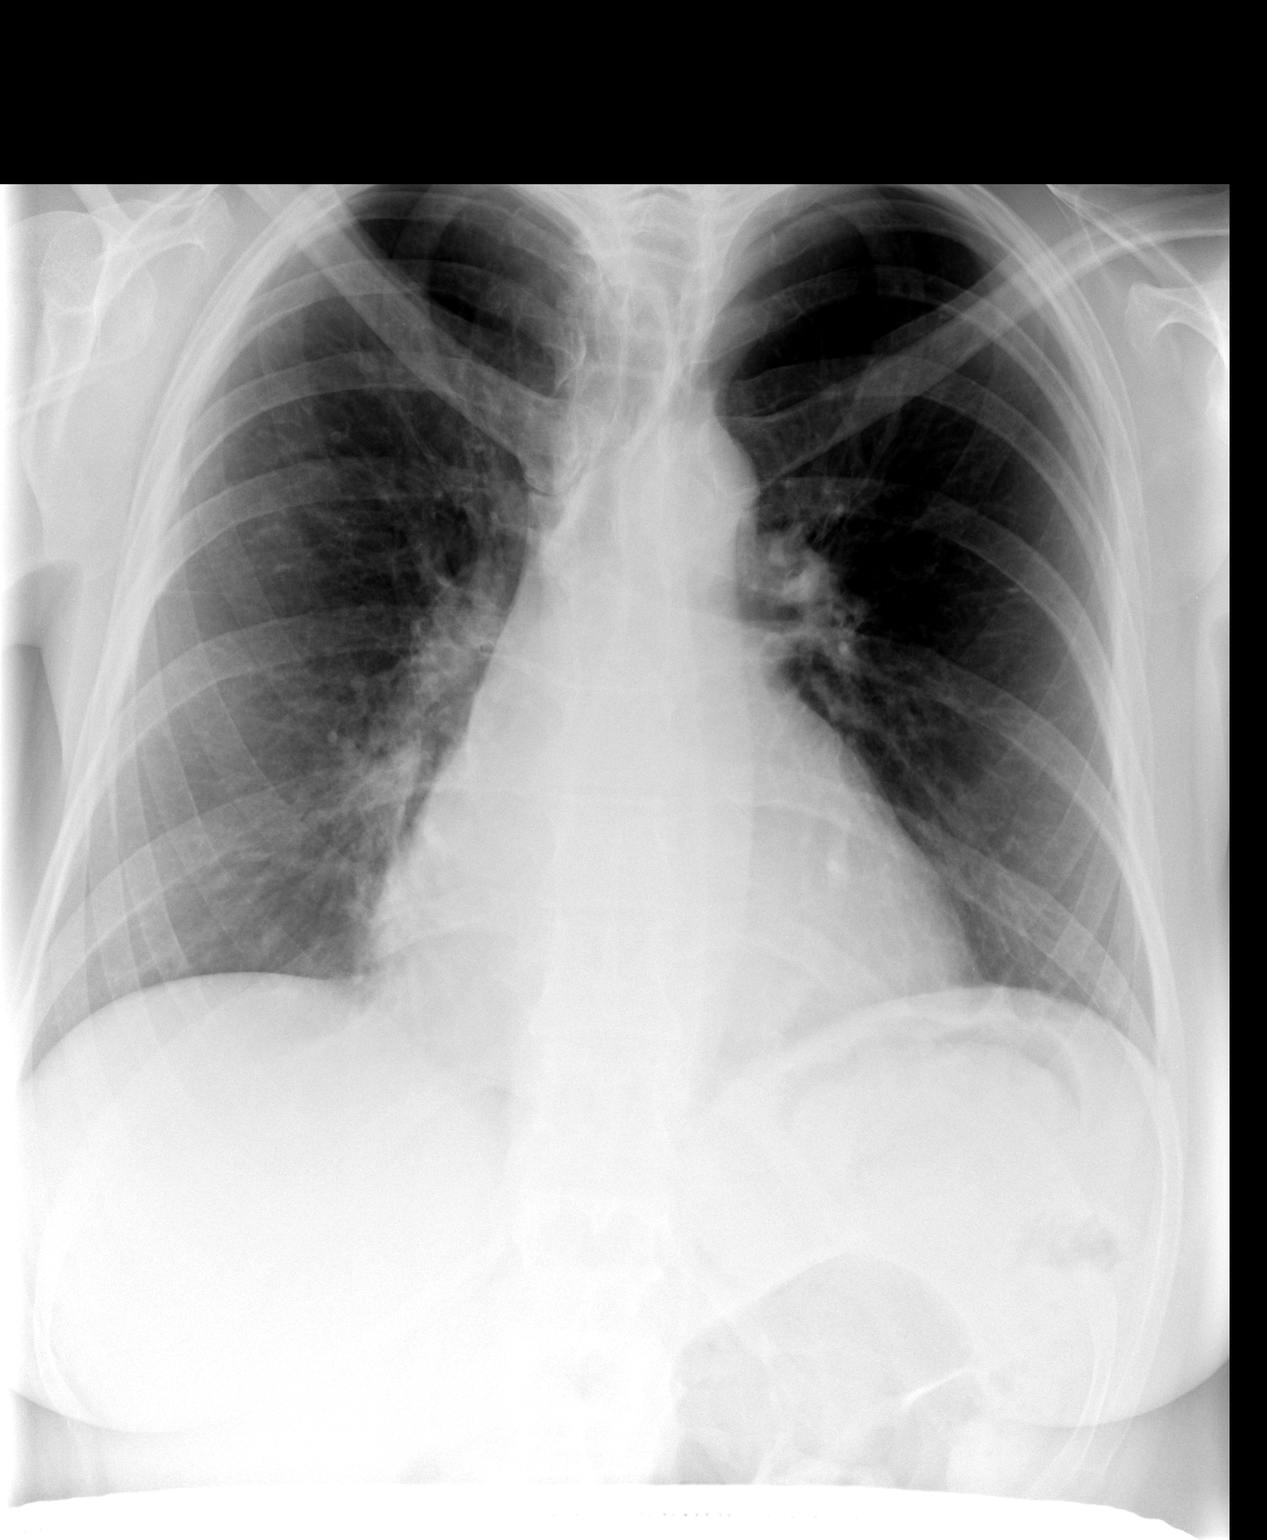

[view not recorded (2 of 2)]
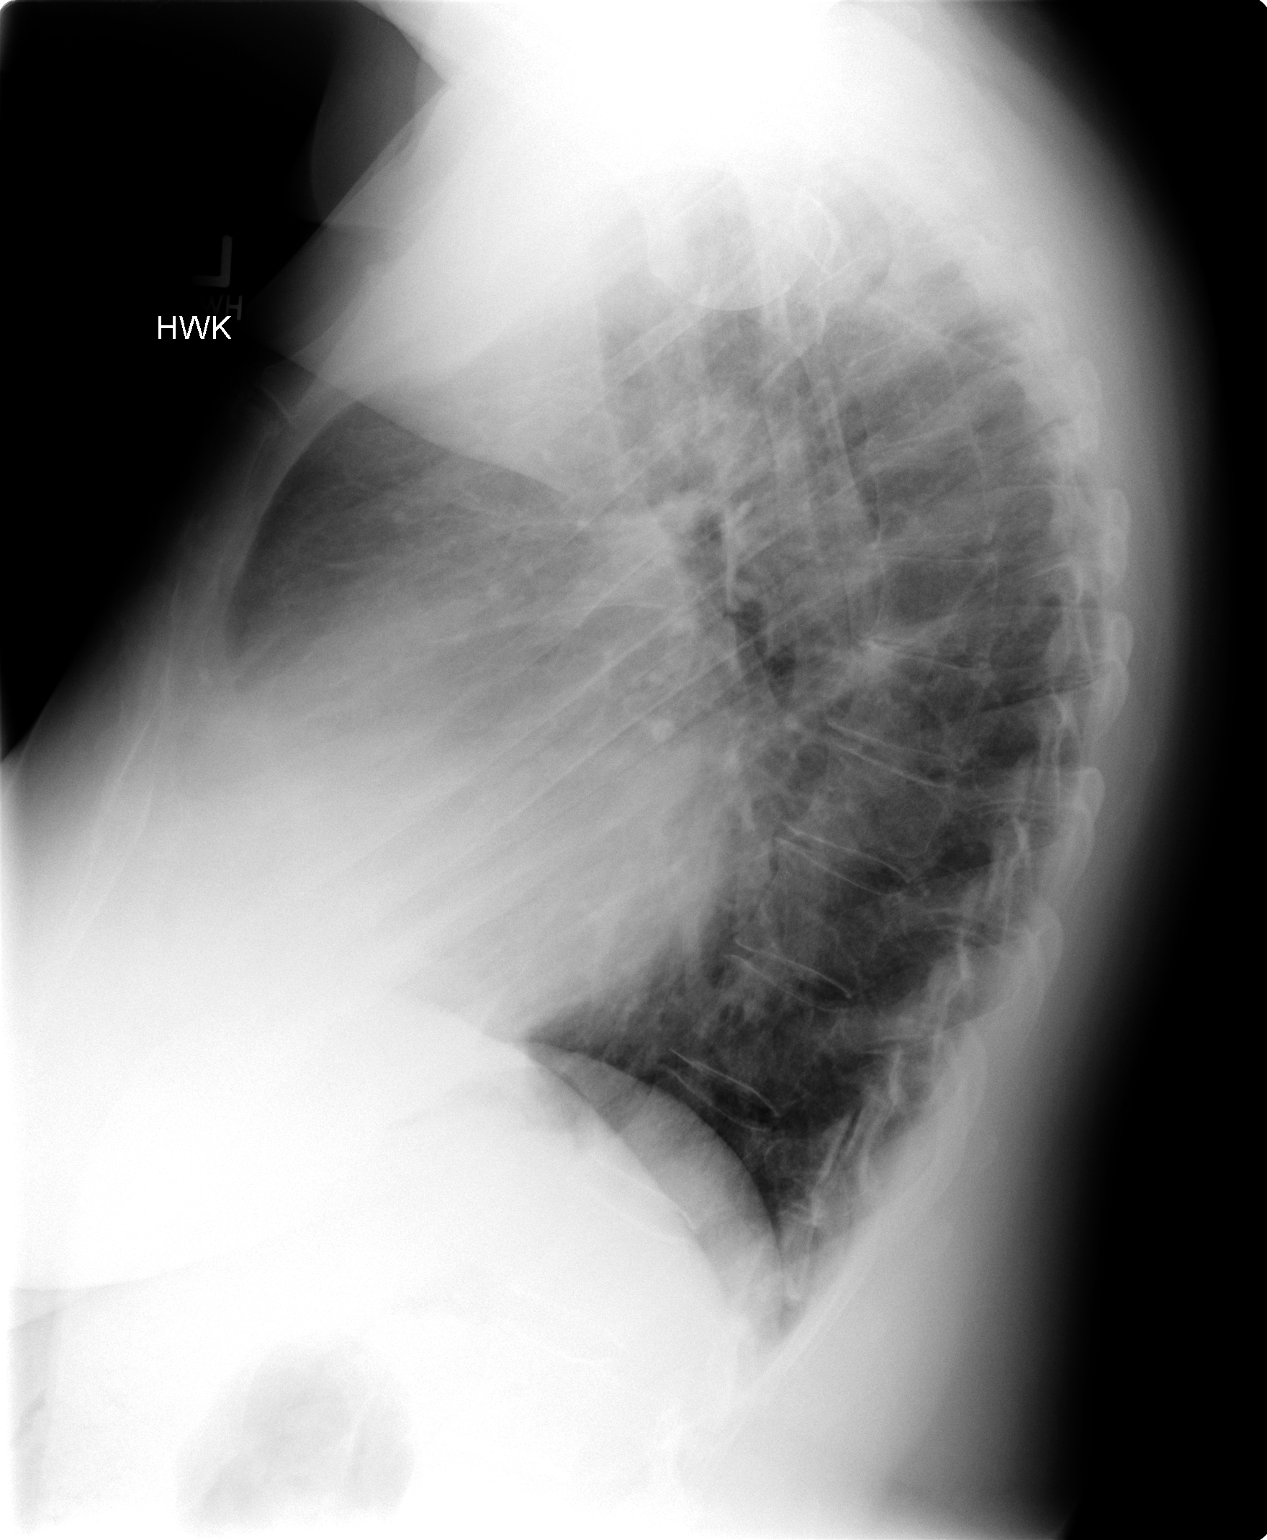

[2 of 2 positions shown; findings below may reference images not displayed]

FINDINGS: Lateral view degraded by patient arm position. Midline trachea. Mild
cardiomegaly. No pleural effusion or pneumothorax. Clear lungs. No
congestive failure.
IMPRESSION: No evidence of pneumonia.

Cardiomegaly without congestive failure.

## 2015-11-06 DIAGNOSIS — Z8639 Personal history of other endocrine, nutritional and metabolic disease: Secondary | ICD-10-CM | POA: Insufficient documentation

## 2015-12-10 DIAGNOSIS — Q8789 Other specified congenital malformation syndromes, not elsewhere classified: Secondary | ICD-10-CM | POA: Insufficient documentation

## 2016-01-07 DIAGNOSIS — Z953 Presence of xenogenic heart valve: Secondary | ICD-10-CM | POA: Insufficient documentation

## 2016-01-07 DIAGNOSIS — Z951 Presence of aortocoronary bypass graft: Secondary | ICD-10-CM | POA: Insufficient documentation

## 2016-01-07 DIAGNOSIS — Z9889 Other specified postprocedural states: Secondary | ICD-10-CM | POA: Insufficient documentation

## 2016-02-08 ENCOUNTER — Emergency Department: Payer: Medicaid Other

## 2016-02-08 ENCOUNTER — Emergency Department
Admission: EM | Admit: 2016-02-08 | Discharge: 2016-02-08 | Disposition: A | Discharge status: Home / Self Care | Payer: Medicaid Other | Attending: Emergency Medicine | Admitting: Emergency Medicine

## 2016-02-08 ENCOUNTER — Encounter: Payer: Self-pay | Admitting: Emergency Medicine

## 2016-02-08 DIAGNOSIS — Z950 Presence of cardiac pacemaker: Secondary | ICD-10-CM | POA: Insufficient documentation

## 2016-02-08 DIAGNOSIS — Z951 Presence of aortocoronary bypass graft: Secondary | ICD-10-CM | POA: Diagnosis not present

## 2016-02-08 DIAGNOSIS — Z7901 Long term (current) use of anticoagulants: Secondary | ICD-10-CM | POA: Insufficient documentation

## 2016-02-08 DIAGNOSIS — Z79899 Other long term (current) drug therapy: Secondary | ICD-10-CM | POA: Diagnosis not present

## 2016-02-08 DIAGNOSIS — Z79891 Long term (current) use of opiate analgesic: Secondary | ICD-10-CM | POA: Diagnosis not present

## 2016-02-08 DIAGNOSIS — Z7982 Long term (current) use of aspirin: Secondary | ICD-10-CM | POA: Insufficient documentation

## 2016-02-08 DIAGNOSIS — M7989 Other specified soft tissue disorders: Secondary | ICD-10-CM

## 2016-02-08 DIAGNOSIS — R2242 Localized swelling, mass and lump, left lower limb: Secondary | ICD-10-CM | POA: Insufficient documentation

## 2016-02-08 DIAGNOSIS — Z954 Presence of other heart-valve replacement: Secondary | ICD-10-CM | POA: Insufficient documentation

## 2016-02-08 DIAGNOSIS — E0590 Thyrotoxicosis, unspecified without thyrotoxic crisis or storm: Secondary | ICD-10-CM | POA: Insufficient documentation

## 2016-02-08 DIAGNOSIS — I517 Cardiomegaly: Secondary | ICD-10-CM | POA: Diagnosis not present

## 2016-02-08 DIAGNOSIS — Z7952 Long term (current) use of systemic steroids: Secondary | ICD-10-CM | POA: Insufficient documentation

## 2016-02-08 DIAGNOSIS — I4891 Unspecified atrial fibrillation: Secondary | ICD-10-CM | POA: Diagnosis not present

## 2016-02-08 LAB — BASIC METABOLIC PANEL
ANION GAP: 9 (ref 5–15)
BUN: 13 mg/dL (ref 6–20)
CALCIUM: 9.3 mg/dL (ref 8.9–10.3)
CO2: 25 mmol/L (ref 22–32)
CREATININE: 0.85 mg/dL (ref 0.44–1.00)
Chloride: 103 mmol/L (ref 101–111)
GFR calc Af Amer: >60 mL/min (ref >60)
GLUCOSE: 104 mg/dL — AB (ref 65–99)
Potassium: 4 mmol/L (ref 3.5–5.1)
Sodium: 137 mmol/L (ref 135–145)

## 2016-02-08 LAB — CBC WITH DIFFERENTIAL/PLATELET
BASOS PCT: 1 %
Basophils Absolute: 0.1 10*3/uL (ref 0–0.1)
EOS ABS: 0.2 10*3/uL (ref 0–0.7)
EOS PCT: 3 %
HCT: 35.6 % (ref 35.0–47.0)
Hemoglobin: 11.5 g/dL — ABNORMAL LOW (ref 12.0–16.0)
LYMPHS ABS: 0.8 10*3/uL — AB (ref 1.0–3.6)
Lymphocytes Relative: 9 %
MCH: 29.3 pg (ref 26.0–34.0)
MCHC: 32.4 g/dL (ref 32.0–36.0)
MCV: 90.5 fL (ref 80.0–100.0)
MONO ABS: 0.5 10*3/uL (ref 0.2–0.9)
MONOS PCT: 6 %
Neutro Abs: 7.1 10*3/uL — ABNORMAL HIGH (ref 1.4–6.5)
Neutrophils Relative %: 81 %
PLATELETS: 203 10*3/uL (ref 150–440)
RBC: 3.93 MIL/uL (ref 3.80–5.20)
RDW: 15.4 % — AB (ref 11.5–14.5)
WBC: 8.7 10*3/uL (ref 3.6–11.0)

## 2016-02-08 LAB — TROPONIN I: Troponin I: <0.03 ng/mL (ref <0.031)

## 2016-02-08 LAB — PROTIME-INR
INR: 1.84
PROTHROMBIN TIME: 21.2 s — AB (ref 11.4–15.0)

## 2016-02-08 LAB — DIGOXIN LEVEL: DIGOXIN LVL: 1.2 ng/mL (ref 0.8–2.0)

## 2016-02-08 MED ORDER — GABAPENTIN 400 MG PO CAPS
400.0000 mg | ORAL_CAPSULE | Freq: Once | ORAL | Status: AC
  Administered 2016-02-08: 400 mg via ORAL
  Filled 2016-02-08: qty 1

## 2016-02-08 MED ORDER — HYDROMORPHONE HCL 1 MG/ML IJ SOLN
1.0000 mg | Freq: Once | INTRAMUSCULAR | Status: AC
  Administered 2016-02-08: 1 mg via INTRAVENOUS
  Filled 2016-02-08: qty 1

## 2016-02-08 NOTE — Discharge Instructions (Signed)
You were evaluated for left leg swelling, and as we discussed, your ultrasound shows no evidence of DVT or blood clot in the leg, and I suspect it is swelling due to the hematoma/seroma impeding venous flow.  In either case, after discussion with your surgeon, you can keep your follow-up appointment on the 27th. Next the next and return to the emergency department for any new or worsening pain, skin rash or redness, trouble breathing, or any other symptoms concerning to you.   Edema Edema is an abnormal buildup of fluids in your bodytissues. Edema is somewhatdependent on gravity to pull the fluid to the lowest place in your body. That makes the condition more common in the legs and thighs (lower extremities). Painless swelling of the feet and ankles is common and becomes more likely as you get older. It is also common in looser tissues, like around your eyes.  When the affected area is squeezed, the fluid may move out of that spot and leave a dent for a few moments. This dent is called pitting.  CAUSES  There are many possible causes of edema. Eating too much salt and being on your feet or sitting for a long time can cause edema in your legs and ankles. Hot weather may make edema worse. Common medical causes of edema include:  Heart failure.  Liver disease.  Kidney disease.  Weak blood vessels in your legs.  Cancer.  An injury.  Pregnancy.  Some medications.  Obesity. SYMPTOMS  Edema is usually painless.Your skin may look swollen or shiny.  DIAGNOSIS  Your health care provider may be able to diagnose edema by asking about your medical history and doing a physical exam. You may need to have tests such as X-rays, an electrocardiogram, or blood tests to check for medical conditions that may cause edema.  TREATMENT  Edema treatment depends on the cause. If you have heart, liver, or kidney disease, you need the treatment appropriate for these conditions. General treatment may  include:  Elevation of the affected body part above the level of your heart.  Compression of the affected body part. Pressure from elastic bandages or support stockings squeezes the tissues and forces fluid back into the blood vessels. This keeps fluid from entering the tissues.  Restriction of fluid and salt intake.  Use of a water pill (diuretic). These medications are appropriate only for some types of edema. They pull fluid out of your body and make you urinate more often. This gets rid of fluid and reduces swelling, but diuretics can have side effects. Only use diuretics as directed by your health care provider. HOME CARE INSTRUCTIONS   Keep the affected body part above the level of your heart when you are lying down.   Do not sit still or stand for prolonged periods.   Do not put anything directly under your knees when lying down.  Do not wear constricting clothing or garters on your upper legs.   Exercise your legs to work the fluid back into your blood vessels. This may help the swelling go down.   Wear elastic bandages or support stockings to reduce ankle swelling as directed by your health care provider.   Eat a low-salt diet to reduce fluid if your health care provider recommends it.   Only take medicines as directed by your health care provider. SEEK MEDICAL CARE IF:   Your edema is not responding to treatment.  You have heart, liver, or kidney disease and notice symptoms of edema.  You have edema in your legs that does not improve after elevating them.   You have sudden and unexplained weight gain. SEEK IMMEDIATE MEDICAL CARE IF:   You develop shortness of breath or chest pain.   You cannot breathe when you lie down.  You develop pain, redness, or warmth in the swollen areas.   You have heart, liver, or kidney disease and suddenly get edema.  You have a fever and your symptoms suddenly get worse. MAKE SURE YOU:   Understand these  instructions.  Will watch your condition.  Will get help right away if you are not doing well or get worse.   This information is not intended to replace advice given to you by your health care provider. Make sure you discuss any questions you have with your health care provider.   Document Released: 11/07/2005 Document Revised: 11/28/2014 Document Reviewed: 08/30/2013 [employer] Interactive Patient Education [employer]2016 [employer]

## 2016-02-08 NOTE — ED Provider Notes (Signed)
[facility] Emergency Department Provider Note   ____________________________________________  Time seen: Approximately 10:50 AM I have reviewed the triage vital signs and the triage nursing note.  HISTORY  Chief Complaint Leg Swelling   Historian Patient  HPI Brittany Newton is a 55 y.o. female with a history of a variant of Marfan's disease, with recent multiple cardiac surgeries including CABG, pacemaker, and bovine valve replacement. She was somewhat recently started on digoxin, just 2 days ago. She states that her baseline heart rate is elevated and often time will go into the 140s.  She states that she saw the PA from the cardiac surgeon office about one week ago when she had a JP drain removed from her left upper leg/groin at the site of the vein harvest for the CABG. She had had continued fluid drainage from that areauntil a few days ago when it scabbed over. Since last week, the firm swelling/hematoma at the vein harvest site has not changed, gone up or gone down, but the swelling to the entire distal left lower extremity has increased significantly. She has little bit of a lot of pain with it.  She's having central chest pain, which she describes as postsurgical/musculoskeletal/sternal, and she does not feel like it is anything more significant than postsurgical pain. She didn't take her Dilaudid yet this morning.    Past Medical History  Diagnosis Date  . Cardiomegaly   . Atrial fibrillation (HCC)   . Thyroid disease     hyperthyroidism  . Shingles   . Loeys-Dietz syndrome     type 1    There are no active problems to display for this patient.   Past Surgical History  Procedure Laterality Date  . Abdominal hysterectomy  2009    complete   . Cesarean section  1994    emergency  . Hernia repair  1965    double hernia repair  . Pacemaker insertion    . Mitral valve repair    . Aortic valve replacement      bovine  . Minimally invasive  tricuspid valve repair    . Maze    . Coronary artery bypass graft      Current Outpatient Rx  Name  Route  Sig  Dispense  Refill  . acetaminophen (TYLENOL) 500 MG tablet   Oral   Take 500 mg by mouth every 8 (eight) hours as needed for pain.         Marland Kitchen aspirin EC 81 MG tablet   Oral   Take 81 mg by mouth daily.         Marland Kitchen atorvastatin (LIPITOR) 40 MG tablet   Oral   Take 40 mg by mouth daily.         . cholecalciferol (VITAMIN D) 1000 units tablet   Oral   Take 2,000 Units by mouth daily.         . diclofenac sodium (VOLTAREN) 1 % GEL   Topical   Apply 2 g topically 2 (two) times daily as needed.         . digoxin (LANOXIN) 0.25 MG tablet   Oral   Take 0.25 mg by mouth daily.         . fexofenadine (ALLEGRA) 180 MG tablet   Oral   Take 180 mg by mouth every evening.         . furosemide (LASIX) 40 MG tablet   Oral   Take 40 mg by mouth.         Marland Kitchen  gabapentin (NEURONTIN) 400 MG capsule   Oral   Take 400 mg by mouth 3 (three) times daily.         Marland Kitchen HYDROmorphone (DILAUDID) 2 MG tablet   Oral   Take 2 mg by mouth 2 (two) times daily.         Marland Kitchen levalbuterol (XOPENEX HFA) 45 MCG/ACT inhaler   Inhalation   Inhale 2 puffs into the lungs every 4 (four) hours as needed for wheezing.         . metoprolol (LOPRESSOR) 50 MG tablet   Oral   Take 50 mg by mouth 2 (two) times daily.         . potassium chloride SA (K-DUR,KLOR-CON) 20 MEQ tablet   Oral   Take 20 mEq by mouth daily.         . predniSONE (DELTASONE) 5 MG tablet   Oral   Take 5 mg by mouth daily with breakfast.         . warfarin (COUMADIN) 5 MG tablet   Oral   Take 5 mg by mouth daily at 6 PM.           Allergies Aspirin and Albuterol  No family history on file.  Social History Social History  Substance Use Topics  . Smoking status: Never Smoker   . Smokeless tobacco: None  . Alcohol Use: No    Review of Systems  Constitutional: Negative for fever. Eyes:  Negative for visual changes. ENT: Negative for sore throat. Cardiovascular: Positive for sternal pain Respiratory: Negative for shortness of breath. Gastrointestinal: Negative for abdominal pain, vomiting and diarrhea. Genitourinary: Negative for dysuria. Musculoskeletal: Negative for back pain. Skin: Negative for rash. Neurological: Negative for headache. 10 point Review of Systems otherwise negative ____________________________________________   PHYSICAL EXAM:  VITAL SIGNS: ED Triage Vitals  Enc Vitals Group     BP 02/08/16 0956 110/55 mmHg     Pulse Rate 02/08/16 0956 125     Resp 02/08/16 0956 16     Temp 02/08/16 0956 98 F (36.7 C)     Temp Source 02/08/16 0956 Oral     SpO2 02/08/16 0956 100 %     Weight 02/08/16 0956 250 lb (113.399 kg)     Height 02/08/16 0956  (1.88 m)     Head Cir --      Peak Flow --      Pain Score 02/08/16 0957 5     Pain Loc --      Pain Edu? --      Excl. in GC? --      Constitutional: Alert and oriented. Well appearing and in no distress. HEENT   Head: Normocephalic and atraumatic.      Eyes: Conjunctivae are normal. PERRL. Normal extraocular movements.      Ears:         Nose: No congestion/rhinnorhea.   Mouth/Throat: Mucous membranes are moist.   Neck: No stridor. Cardiovascular/Chest: Irregularly irregular and tachycardic.  No murmurs, rubs, or gallops. Healing sternotomy. Respiratory: Normal respiratory effort without tachypnea nor retractions. Breath sounds are clear and equal bilaterally. No wheezes/rales/rhonchi. Gastrointestinal: Soft. No distention, no guarding, no rebound. Nontender.    Genitourinary/rectal:Deferred Musculoskeletal: Right lower extremity trace edema. Left lower extremity has a healed skin incision over the vein harvest site at the proximal anterior left lower extremity, and a healed site at the scab where the JP drain had been. She has a firm hematoma-type swelling proximally near the groin,  nonpulsatile, and moderately tender to palpation. She has 4+ pitting edema of the entire left lower show me from the foot up to the groin. Neurologic:  Normal speech and language. No gross or focal neurologic deficits are appreciated. Skin:  Skin is warm, dry and intact. No rash noted. Psychiatric: Mood and affect are normal. Speech and behavior are normal. Patient exhibits appropriate insight and judgment.  ____________________________________________   EKG I, Governor Rooksebecca Lamaj Metoyer, MD, the attending physician have personally viewed and interpreted all ECGs.  126 bpm. Irregular Lee irregular rhythm, with some native beats, some atrially sensed ventricularly paced beats, and occasional PVCs. ST segment limited by areas that are ventricular paced. T waves are visualized and inverted laterally.  ____________________________________________  LABS (pertinent positives/negatives)  Basic metabolic panel without significant abnormalities and I troponin less than 0.03 White blood count 8.7, hemoglobin 11.5 INR 1.84 Digoxin 1.2  ____________________________________________  RADIOLOGY All Xrays were viewed by me. Imaging interpreted by Radiologist.  Chest two-view:  IMPRESSION: Cardiac enlargement has progressed in the interval with interval mitral valve and aortic valve replacement. Interval placement of pacemaker.  Small bilateral effusions and left lower lobe atelectasis. Negative for edema.  Ultrasound for DVT left lower extremity:  IMPRESSION: 1. No evidence of DVT within left lower extremity. 2. Approximately 15 cm fluid collection within the left groin extending to involve the proximal thigh, indeterminate though favored to represent a postoperative seroma. Clinical correlation is advised. __________________________________________  PROCEDURES  Procedure(s) performed: None  Critical Care performed: None  ____________________________________________   ED COURSE / ASSESSMENT AND  PLAN  Pertinent labs & imaging results that were available during my care of the patient were reviewed by me and considered in my medical decision making (see chart for details).   This is a very complicated cardiac patient, with recent cardiac surgery, and complicated by hematoma and JP drain to the left lower extremity vein harvest site, which scabbed over about a week ago, and now the entire left lower extremity as extremities well.  There is no evidence of cellulitis. Patient does not seem to be exhibiting evidence of acute congestive heart failure. She does have an irregular heartbeat, and it is tachycardic at times, but patient states really this is typical for her.  My concern is more for lower show any DVT versus continued complication of hematoma/seroma from vein harvest site.   Ultrasound shows no DVT. Next the next line and was able to discuss with Dr.Graca, patient's cardiothoracic surgeon who given the description, also favors seroma/hematoma impeding venous flow. She can keep her appointment for Monday the 27th.  Sure her INR is 1.84, however he asked her just to continue and not make any changes at this point time.   She has no evidence of acute congestive heart failure. No recommendation for change of Lasix at this point.    CONSULTATIONS:   Cardiothoracic surgeon, Dr.Graca, discussed case by phone   Patient / Family / Caregiver informed of clinical course, medical decision-making process, and agree with plan.   I discussed return precautions, follow-up instructions, and discharged instructions with patient and/or family.   ___________________________________________   FINAL CLINICAL IMPRESSION(S) / ED DIAGNOSES   Final diagnoses:  Left leg swelling              Note: This dictation was prepared with Dragon dictation. Any transcriptional errors that result from this process are unintentional   Governor Rooksebecca Alexsandra Shontz, MD 02/08/16 [phone]

## 2016-02-08 NOTE — ED Notes (Signed)
Pt transported to xray 

## 2016-02-08 NOTE — ED Notes (Signed)
Leg swelling since surgery mv repare, aortic valve replacement, cabg maze procedre.  pacemeaker placement.  Worse since Friday.

## 2016-02-08 NOTE — ED Notes (Signed)
Pt from home; states she saw cardiologist for follow up on Friday (CABG on 01/07/2016; pacemaker 01/12/2016). Cardio thoracic team called her today and told her to come here regarding swelling in left leg/foot. States she has a fax number for team to send results to so care can be coordinated ([facility]). Pt states that she had drainage from left leg and then it stopped abruptly last week. Now edematous. Denies pain other than "normal incisional pain." Pt alert & oriented.

## 2016-04-12 DIAGNOSIS — Z950 Presence of cardiac pacemaker: Secondary | ICD-10-CM | POA: Insufficient documentation

## 2016-09-19 ENCOUNTER — Encounter: Payer: Medicaid Other | Attending: Internal Medicine | Admitting: *Deleted

## 2016-09-19 DIAGNOSIS — Z954 Presence of other heart-valve replacement: Secondary | ICD-10-CM | POA: Diagnosis present

## 2016-09-19 DIAGNOSIS — Z951 Presence of aortocoronary bypass graft: Secondary | ICD-10-CM | POA: Insufficient documentation

## 2016-09-19 DIAGNOSIS — Z952 Presence of prosthetic heart valve: Secondary | ICD-10-CM

## 2016-09-19 DIAGNOSIS — I5022 Chronic systolic (congestive) heart failure: Secondary | ICD-10-CM | POA: Diagnosis present

## 2016-09-19 DIAGNOSIS — Z9889 Other specified postprocedural states: Secondary | ICD-10-CM

## 2016-09-19 NOTE — Patient Instructions (Signed)
Patient Instructions  Patient Details  Name: Brittany AntiguaHelene Spivak MRN: [id number] Date of Birth: Nov 21, 1961 Referring Provider:  Teryl LucyWard, Cary, MD  Below are the personal goals you chose as well as exercise and nutrition goals. Our goal is to help you keep on track towards obtaining and maintaining your goals. We will be discussing your progress on these goals with you throughout the program.  Initial Exercise Prescription:     Initial Exercise Prescription - 09/19/16 1600      Date of Initial Exercise RX and Referring Provider   Date 09/19/16   Referring Provider Ward, Cary MD     Treadmill   MPH 1.8   Grade 0.5   Minutes 15   METs 2.5     NuStep   Level 2   Watts --  80-100 spm   Minutes 15   METs 2     Biostep-RELP   Level 2   Watts --  40-50 spm   Minutes 15   METs 2     Prescription Details   Frequency (times per week) 2   Duration Progress to 45 minutes of aerobic exercise without signs/symptoms of physical distress     Intensity   THRR 40-80% of Max Heartrate <100 bpm  Based on [employer] Exercise Guidelines   Ratings of Perceived Exertion 11-15   Perceived Dyspnea 0-4     Progression   Progression Continue to progress workloads to maintain intensity without signs/symptoms of physical distress.     Resistance Training   Training Prescription Yes   Weight 3 lbs   Reps 10-12      Exercise Goals: Frequency: Be able to perform aerobic exercise three times per week working toward 3-5 days per week.  Intensity: Work with a perceived exertion of 11 (fairly light) - 15 (hard) as tolerated. Follow your new exercise prescription and watch for changes in prescription as you progress with the program. Changes will be reviewed with you when they are made.  Duration: You should be able to do 30 minutes of continuous aerobic exercise in addition to a 5 minute warm-up and a 5 minute cool-down routine.  Nutrition Goals: Your personal nutrition goals will be  established when you do your nutrition analysis with the dietician.  The following are nutrition guidelines to follow: Cholesterol < 200mg /day Sodium < 1500mg /day Fiber: Women over 50 yrs - 21 grams per day  Personal Goals:     Personal Goals and Risk Factors at Admission - 09/19/16 1547      Core Components/Risk Factors/Patient Goals on Admission    Weight Management Obesity;Weight Loss;Yes   Intervention Weight Management: Develop a combined nutrition and exercise program designed to reach desired caloric intake, while maintaining appropriate intake of nutrient and fiber, sodium and fats, and appropriate energy expenditure required for the weight goal.;Weight Management: Provide education and appropriate resources to help participant work on and attain dietary goals.;Weight Management/Obesity: Establish reasonable short term and long term weight goals.;Obesity: Provide education and appropriate resources to help participant work on and attain dietary goals.   Admit Weight 237 lb 4.8 oz (107.6 kg)   Goal Weight: Short Term 234 lb (106.1 kg)   Goal Weight: Long Term 180 lb (81.6 kg)   Expected Outcomes Short Term: Continue to assess and modify interventions until short term weight is achieved;Long Term: Adherence to nutrition and physical activity/exercise program aimed toward attainment of established weight goal;Weight Loss: Understanding of general recommendations for a balanced deficit meal plan, which promotes 1-2  lb weight loss per week and includes a negative energy balance of [phone] kcal/d   Sedentary Yes   Intervention Provide advice, education, support and counseling about physical activity/exercise needs.;Develop an individualized exercise prescription for aerobic and resistive training based on initial evaluation findings, risk stratification, comorbidities and participant's personal goals.   Expected Outcomes Achievement of increased cardiorespiratory fitness and enhanced  flexibility, muscular endurance and strength shown through measurements of functional capacity and personal statement of participant.   Increase Strength and Stamina Yes   Intervention Provide advice, education, support and counseling about physical activity/exercise needs.;Develop an individualized exercise prescription for aerobic and resistive training based on initial evaluation findings, risk stratification, comorbidities and participant's personal goals.   Expected Outcomes Achievement of increased cardiorespiratory fitness and enhanced flexibility, muscular endurance and strength shown through measurements of functional capacity and personal statement of participant.   Lipids Yes   Intervention Provide education and support for participant on nutrition & aerobic/resistive exercise along with prescribed medications to achieve LDL 70mg , HDL >40mg .   Expected Outcomes Short Term: Participant states understanding of desired cholesterol values and is compliant with medications prescribed. Participant is following exercise prescription and nutrition guidelines.;Long Term: Cholesterol controlled with medications as prescribed, with individualized exercise RX and with personalized nutrition plan. Value goals: LDL < 70mg , HDL > 40 mg.      Tobacco Use Initial Evaluation: History  Smoking Status  . Never Smoker  Smokeless Tobacco  . Not on file    Copy of goals given to participant.

## 2016-09-19 NOTE — Progress Notes (Signed)
Cardiac Individual Treatment Plan  Patient Details  Name: Brittany Newton MRN: [id number] Date of Birth: 15-Sep-1961 Referring Provider:   Flowsheet Row Cardiac Rehab from 09/19/2016 in [facility] Cardiac and Pulmonary Rehab  Referring Provider  Ward, Georga Hacking MD      Initial Encounter Date:  Flowsheet Row Cardiac Rehab from 09/19/2016 in [facility] Cardiac and Pulmonary Rehab  Date  09/19/16  Referring Provider  Ward, Georga Hacking MD      Visit Diagnosis: S/P aortic valve replacement  S/P tricuspid valve repair  S/P mitral valve repair  S/P CABG x 1  Patient's Home Medications on Admission:  Current Outpatient Prescriptions:  .  acetaminophen (TYLENOL) 500 MG tablet, Take 500 mg by mouth every 8 (eight) hours as needed for pain., Disp: , Rfl:  .  aspirin EC 81 MG tablet, Take 81 mg by mouth daily., Disp: , Rfl:  .  atorvastatin (LIPITOR) 40 MG tablet, Take 40 mg by mouth daily., Disp: , Rfl:  .  cholecalciferol (VITAMIN D) 1000 units tablet, Take 2,000 Units by mouth daily., Disp: , Rfl:  .  diclofenac sodium (VOLTAREN) 1 % GEL, Apply 2 g topically 2 (two) times daily as needed., Disp: , Rfl:  .  digoxin (LANOXIN) 0.25 MG tablet, Take 0.25 mg by mouth daily., Disp: , Rfl:  .  fexofenadine (ALLEGRA) 180 MG tablet, Take 180 mg by mouth every evening., Disp: , Rfl:  .  gabapentin (NEURONTIN) 400 MG capsule, Take 400 mg by mouth 3 (three) times daily., Disp: , Rfl:  .  levalbuterol (XOPENEX HFA) 45 MCG/ACT inhaler, Inhale 2 puffs into the lungs every 4 (four) hours as needed for wheezing., Disp: , Rfl:  .  metoprolol (LOPRESSOR) 50 MG tablet, Take 50 mg by mouth 2 (two) times daily., Disp: , Rfl:  .  predniSONE (DELTASONE) 5 MG tablet, Take 5 mg by mouth daily with breakfast., Disp: , Rfl:  .  warfarin (COUMADIN) 5 MG tablet, Take 5 mg by mouth daily at 6 PM., Disp: , Rfl:  .  furosemide (LASIX) 40 MG tablet, Take 40 mg by mouth., Disp: , Rfl:  .  HYDROmorphone (DILAUDID) 2 MG tablet, Take 2 mg by mouth 2  (two) times daily., Disp: , Rfl:  .  potassium chloride SA (K-DUR,KLOR-CON) 20 MEQ tablet, Take 20 mEq by mouth daily., Disp: , Rfl:   Past Medical History: Past Medical History:  Diagnosis Date  . Atrial fibrillation (HCC)   . Cardiomegaly   . Loeys-Dietz syndrome    type 1  . Shingles   . Thyroid disease    hyperthyroidism    Tobacco Use: History  Smoking Status  . Never Smoker  Smokeless Tobacco  . Not on file    Labs: Recent Review Flowsheet Data    Labs for ITP Cardiac and Pulmonary Rehab Latest Ref Rng & Units 07/23/2011 07/28/2011 06/14/2013   Cholestrol 0 - 200 mg/dL - - 956(O)   LDLCALC 0 - 100 mg/dL - - 130(Q)   HDL 40 - 60 mg/dL - - 65(H)   Trlycerides 0 - 200 mg/dL - - 87   PHART 8.469 - 7.400 - 7.468(H) -   PCO2ART 35.0 - 45.0 mmHg - 32.3(L) -   HCO3 20.0 - 24.0 mEq/L - 23.1 -   TCO2 0 - 100 mmol/L 19 20.3 -   O2SAT % - 96.1 -       Exercise Target Goals: Date: 09/19/16  Exercise Program Goal: Individual exercise prescription set with THRR, safety &  activity barriers. Participant demonstrates ability to understand and report RPE using BORG scale, to self-measure pulse accurately, and to acknowledge the importance of the exercise prescription.  Exercise Prescription Goal: Starting with aerobic activity 30 plus minutes a day, 3 days per week for initial exercise prescription. Provide home exercise prescription and guidelines that participant acknowledges understanding prior to discharge.  Activity Barriers & Risk Stratification:     Activity Barriers & Cardiac Risk Stratification - 09/19/16 1536      Activity Barriers & Cardiac Risk Stratification   Activity Barriers Joint Problems;Deconditioning;Muscular Weakness  Has Loeys-Dietz syndrome which may inhibit some exercise levels, has bilateral knee pain   Cardiac Risk Stratification High      6 Minute Walk:     6 Minute Walk    Row Name 09/19/16 1604         6 Minute Walk   Phase Initial      Distance 1185 feet     Walk Time 6 minutes     # of Rest Breaks 0     MPH 2.24     METS 3.47     RPE 11     VO2 Peak 12.13     Symptoms Yes (comment)     Comments knees achy     Resting HR 67 bpm     Resting BP 136/72     Max Ex. HR 96 bpm     Max Ex. BP 138/70     2 Minute Post BP 132/66        Initial Exercise Prescription:     Initial Exercise Prescription - 09/19/16 1600      Date of Initial Exercise RX and Referring Provider   Date 09/19/16   Referring Provider Ward, Cary MD     Treadmill   MPH 1.8   Grade 0.5   Minutes 15   METs 2.5     NuStep   Level 2   Watts --  80-100 spm   Minutes 15   METs 2     Biostep-RELP   Level 2   Watts --  40-50 spm   Minutes 15   METs 2     Prescription Details   Frequency (times per week) 2   Duration Progress to 45 minutes of aerobic exercise without signs/symptoms of physical distress     Intensity   THRR 40-80% of Max Heartrate <100 bpm  Based on [employer] Exercise Guidelines   Ratings of Perceived Exertion 11-15   Perceived Dyspnea 0-4     Progression   Progression Continue to progress workloads to maintain intensity without signs/symptoms of physical distress.     Resistance Training   Training Prescription Yes   Weight 3 lbs   Reps 10-12      Perform Capillary Blood Glucose checks as needed.  Exercise Prescription Changes:     Exercise Prescription Changes    Row Name 09/19/16 1500             Exercise Review   Progression -  Walk Test Results         Response to Exercise   Blood Pressure (Admit) 136/72       Blood Pressure (Exercise) 138/70       Blood Pressure (Exit) 132/66       Heart Rate (Admit) 67 bpm       Heart Rate (Exercise) 96 bpm       Heart Rate (Exit) 69 bpm  Oxygen Saturation (Admit) 99 %       Oxygen Saturation (Exercise) 100 %       Rating of Perceived Exertion (Exercise) 11       Symptoms knees achy          Exercise Comments:   Discharge  Exercise Prescription (Final Exercise Prescription Changes):     Exercise Prescription Changes - 09/19/16 1500      Exercise Review   Progression --  Walk Test Results     Response to Exercise   Blood Pressure (Admit) 136/72   Blood Pressure (Exercise) 138/70   Blood Pressure (Exit) 132/66   Heart Rate (Admit) 67 bpm   Heart Rate (Exercise) 96 bpm   Heart Rate (Exit) 69 bpm   Oxygen Saturation (Admit) 99 %   Oxygen Saturation (Exercise) 100 %   Rating of Perceived Exertion (Exercise) 11   Symptoms knees achy      Nutrition:  Target Goals: Understanding of nutrition guidelines, daily intake of sodium 1500mg , cholesterol 200mg , calories 30% from fat and 7% or less from saturated fats, daily to have 5 or more servings of fruits and vegetables.  Biometrics:    Nutrition Therapy Plan and Nutrition Goals:     Nutrition Therapy & Goals - 09/19/16 1532      Intervention Plan   Intervention Prescribe, educate and counsel regarding individualized specific dietary modifications aiming towards targeted core components such as weight, hypertension, lipid management, diabetes, heart failure and other comorbidities.   Expected Outcomes Short Term Goal: Understand basic principles of dietary content, such as calories, fat, sodium, cholesterol and nutrients.;Short Term Goal: A plan has been developed with personal nutrition goals set during dietitian appointment.;Long Term Goal: Adherence to prescribed nutrition plan.      Nutrition Discharge: Rate Your Plate Scores:     Nutrition Assessments - 09/19/16 1532      Rate Your Plate Scores   Pre Score 54   Pre Score % 62.1 %      Nutrition Goals Re-Evaluation:   Psychosocial: Target Goals: Acknowledge presence or absence of depression, maximize coping skills, provide positive support system. Participant is able to verbalize types and ability to use techniques and skills needed for reducing stress and depression.  Initial Review  & Psychosocial Screening:     Initial Psych Review & Screening - 09/19/16 1532      Initial Review   Current issues with Current Stress Concerns   Source of Stress Concerns Chronic Illness;Financial  Not working presently, plans to apply for disability   Comments Single mother with 3 children ages 14,10 and 8 at home.  Mother lives with her too. One child with austism.     Family Dynamics   Good Support System? Yes  Mother ,daughter that live in [place]     Barriers   Psychosocial barriers to participate in program There are no identifiable barriers or psychosocial needs.;The patient should benefit from training in stress management and relaxation.     Screening Interventions   Interventions Encouraged to exercise      Quality of Life Scores:     Quality of Life - 09/19/16 1530      Quality of Life Scores   Health/Function Pre 27 %   Socioeconomic Pre 22.57 %   Psych/Spiritual Pre 26.57 %   Family Pre 30 %   GLOBAL Pre 26.27 %      PHQ-9: Recent Review Flowsheet Data    Depression screen [facility] 2/9 09/19/2016  Decreased Interest 1   Down, Depressed, Hopeless 1   PHQ - 2 Score 2   Altered sleeping 3   Tired, decreased energy 3    Change in appetite 0    Feeling bad or failure about yourself  0   Trouble concentrating 0   Moving slowly or fidgety/restless 0   Suicidal thoughts 0   PHQ-9 Score 8   Difficult doing work/chores Not difficult at all      Psychosocial Evaluation and Intervention:   Psychosocial Re-Evaluation:   Vocational Rehabilitation: Provide vocational rehab assistance to qualifying candidates.   Vocational Rehab Evaluation & Intervention:     Vocational Rehab - 09/19/16 1530      Initial Vocational Rehab Evaluation & Intervention   Assessment shows need for Vocational Rehabilitation No      Education: Education Goals: Education classes will be provided on a weekly basis, covering required topics. Participant will state  understanding/return demonstration of topics presented.  Learning Barriers/Preferences:     Learning Barriers/Preferences - 09/19/16 1538      Learning Barriers/Preferences   Learning Barriers None   Learning Preferences None      Education Topics: General Nutrition Guidelines/Fats and Fiber: -Group instruction provided by verbal, written material, models and posters to present the general guidelines for heart healthy nutrition. Gives an explanation and review of dietary fats and fiber.   Controlling Sodium/Reading Food Labels: -Group verbal and written material supporting the discussion of sodium use in heart healthy nutrition. Review and explanation with models, verbal and written materials for utilization of the food label.   Exercise Physiology & Risk Factors: - Group verbal and written instruction with models to review the exercise physiology of the cardiovascular system and associated critical values. Details cardiovascular disease risk factors and the goals associated with each risk factor.   Aerobic Exercise & Resistance Training: - Gives group verbal and written discussion on the health impact of inactivity. On the components of aerobic and resistive training programs and the benefits of this training and how to safely progress through these programs.   Flexibility, Balance, General Exercise Guidelines: - Provides group verbal and written instruction on the benefits of flexibility and balance training programs. Provides general exercise guidelines with specific guidelines to those with heart or lung disease. Demonstration and skill practice provided.   Stress Management: - Provides group verbal and written instruction about the health risks of elevated stress, cause of high stress, and healthy ways to reduce stress.   Depression: - Provides group verbal and written instruction on the correlation between heart/lung disease and depressed mood, treatment options, and the  stigmas associated with seeking treatment.   Anatomy & Physiology of the Heart: - Group verbal and written instruction and models provide basic cardiac anatomy and physiology, with the coronary electrical and arterial systems. Review of: AMI, Angina, Valve disease, Heart Failure, Cardiac Arrhythmia, Pacemakers, and the ICD.   Cardiac Procedures: - Group verbal and written instruction and models to describe the testing methods done to diagnose heart disease. Reviews the outcomes of the test results. Describes the treatment choices: Medical Management, Angioplasty, or Coronary Bypass Surgery.   Cardiac Medications: - Group verbal and written instruction to review commonly prescribed medications for heart disease. Reviews the medication, class of the drug, and side effects. Includes the steps to properly store meds and maintain the prescription regimen.   Go Sex-Intimacy & Heart Disease, Get SMART - Goal Setting: - Group verbal and written instruction through game format to discuss  heart disease and the return to sexual intimacy. Provides group verbal and written material to discuss and apply goal setting through the application of the S.M.A.R.T. Method.   Other Matters of the Heart: - Provides group verbal, written materials and models to describe Heart Failure, Angina, Valve Disease, and Diabetes in the realm of heart disease. Includes description of the disease process and treatment options available to the cardiac patient.   Exercise & Equipment Safety: - Individual verbal instruction and demonstration of equipment use and safety with use of the equipment. Flowsheet Row Cardiac Rehab from 09/19/2016 in [facility] Cardiac and Pulmonary Rehab  Date  09/19/16  Educator  SB  Instruction Review Code  2- meets goals/outcomes      Infection Prevention: - Provides verbal and written material to individual with discussion of infection control including proper hand washing and proper equipment  cleaning during exercise session. Flowsheet Row Cardiac Rehab from 09/19/2016 in [facility] Cardiac and Pulmonary Rehab  Date  09/19/16  Educator  SB  Instruction Review Code  2- meets goals/outcomes      Falls Prevention: - Provides verbal and written material to individual with discussion of falls prevention and safety.   Diabetes: - Individual verbal and written instruction to review signs/symptoms of diabetes, desired ranges of glucose level fasting, after meals and with exercise. Advice that pre and post exercise glucose checks will be done for 3 sessions at entry of program.    Knowledge Questionnaire Score:     Knowledge Questionnaire Score - 09/19/16 1530      Knowledge Questionnaire Score   Pre Score 24/28      Core Components/Risk Factors/Patient Goals at Admission:     Personal Goals and Risk Factors at Admission - 09/19/16 1547      Core Components/Risk Factors/Patient Goals on Admission    Weight Management Obesity;Weight Loss;Yes   Intervention Weight Management: Develop a combined nutrition and exercise program designed to reach desired caloric intake, while maintaining appropriate intake of nutrient and fiber, sodium and fats, and appropriate energy expenditure required for the weight goal.;Weight Management: Provide education and appropriate resources to help participant work on and attain dietary goals.;Weight Management/Obesity: Establish reasonable short term and long term weight goals.;Obesity: Provide education and appropriate resources to help participant work on and attain dietary goals.   Admit Weight 237 lb 4.8 oz (107.6 kg)   Goal Weight: Short Term 234 lb (106.1 kg)   Goal Weight: Long Term 180 lb (81.6 kg)   Expected Outcomes Short Term: Continue to assess and modify interventions until short term weight is achieved;Long Term: Adherence to nutrition and physical activity/exercise program aimed toward attainment of established weight goal;Weight Loss:  Understanding of general recommendations for a balanced deficit meal plan, which promotes 1-2 lb weight loss per week and includes a negative energy balance of [phone] kcal/d   Sedentary Yes   Intervention Provide advice, education, support and counseling about physical activity/exercise needs.;Develop an individualized exercise prescription for aerobic and resistive training based on initial evaluation findings, risk stratification, comorbidities and participant's personal goals.   Expected Outcomes Achievement of increased cardiorespiratory fitness and enhanced flexibility, muscular endurance and strength shown through measurements of functional capacity and personal statement of participant.   Increase Strength and Stamina Yes   Intervention Provide advice, education, support and counseling about physical activity/exercise needs.;Develop an individualized exercise prescription for aerobic and resistive training based on initial evaluation findings, risk stratification, comorbidities and participant's personal goals.   Expected Outcomes Achievement of increased  cardiorespiratory fitness and enhanced flexibility, muscular endurance and strength shown through measurements of functional capacity and personal statement of participant.   Lipids Yes   Intervention Provide education and support for participant on nutrition & aerobic/resistive exercise along with prescribed medications to achieve LDL 70mg , HDL >40mg .   Expected Outcomes Short Term: Participant states understanding of desired cholesterol values and is compliant with medications prescribed. Participant is following exercise prescription and nutrition guidelines.;Long Term: Cholesterol controlled with medications as prescribed, with individualized exercise RX and with personalized nutrition plan. Value goals: LDL < 70mg , HDL > 40 mg.      Core Components/Risk Factors/Patient Goals Review:    Core Components/Risk Factors/Patient Goals at  Discharge (Final Review):    ITP Comments:     ITP Comments    Row Name 09/19/16 1540           ITP Comments Medical review completed. Intitial ITP created.  Documentation of Diagnosis can be found in [facility] Encounter 01/05/2016          Comments: Initial ITP

## 2016-09-27 ENCOUNTER — Encounter: Payer: Medicaid Other | Attending: Internal Medicine | Admitting: *Deleted

## 2016-09-27 DIAGNOSIS — I5022 Chronic systolic (congestive) heart failure: Secondary | ICD-10-CM | POA: Insufficient documentation

## 2016-09-27 DIAGNOSIS — Z954 Presence of other heart-valve replacement: Secondary | ICD-10-CM | POA: Diagnosis present

## 2016-09-27 DIAGNOSIS — Z951 Presence of aortocoronary bypass graft: Secondary | ICD-10-CM

## 2016-09-27 DIAGNOSIS — Z952 Presence of prosthetic heart valve: Secondary | ICD-10-CM

## 2016-09-27 DIAGNOSIS — Z9889 Other specified postprocedural states: Secondary | ICD-10-CM

## 2016-09-27 NOTE — Progress Notes (Signed)
Daily Session Note  Patient Details  Name: Brittany Newton MRN: [id number] Date of Birth: 1961/08/14 Referring Provider:   Flowsheet Row Cardiac Rehab from 09/19/2016 in [facility] Cardiac and Pulmonary Rehab  Referring Provider  Ward, Jeani Hawking MD      Encounter Date: 09/27/2016  Check In:     Session Check In - 09/27/16 0824      Check-In   Location [facility]-Cardiac & Pulmonary Rehab   Staff Present Alberteen Sam, MA, ACSM RCEP, Exercise Physiologist;Susanne Bice, RN, BSN, CCRP;Laureen Owens Shark, BS, RRT, Respiratory Therapist   Supervising physician immediately available to respond to emergencies See telemetry face sheet for immediately available ER MD   Medication changes reported     No   Fall or balance concerns reported    No   Warm-up and Cool-down Performed on first and last piece of equipment   Resistance Training Performed Yes   VAD Patient? No     Pain Assessment   Currently in Pain? No/denies   Multiple Pain Sites No         Goals Met:  Exercise tolerated well Personal goals reviewed No report of cardiac concerns or symptoms Strength training completed today  Goals Unmet:  Not Applicable  Comments: First full day of exercise!  Patient was oriented to gym and equipment including functions, settings, policies, and procedures.  Patient's individual exercise prescription and treatment plan were reviewed.  All starting workloads were established based on the results of the 6 minute walk test done at initial orientation visit.  The plan for exercise progression was also introduced and progression will be customized based on patient's performance and goals.    Dr. Emily Filbert is Medical Director for [facility] and [facility].

## 2016-09-28 ENCOUNTER — Encounter: Payer: Self-pay | Admitting: *Deleted

## 2016-09-28 DIAGNOSIS — Z951 Presence of aortocoronary bypass graft: Secondary | ICD-10-CM

## 2016-09-28 DIAGNOSIS — Z9889 Other specified postprocedural states: Secondary | ICD-10-CM

## 2016-09-28 DIAGNOSIS — Z952 Presence of prosthetic heart valve: Secondary | ICD-10-CM

## 2016-09-28 NOTE — Progress Notes (Signed)
Cardiac Individual Treatment Plan  Patient Details  Name: Brittany Newton MRN: [id number] Date of Birth: January 16, 1961 Referring Provider:   Flowsheet Row Cardiac Rehab from 09/19/2016 in [facility] Cardiac and Pulmonary Rehab  Referring Provider  Ward, Georga Hacking MD      Initial Encounter Date:  Flowsheet Row Cardiac Rehab from 09/19/2016 in [facility] Cardiac and Pulmonary Rehab  Date  09/19/16  Referring Provider  Ward, Georga Hacking MD      Visit Diagnosis: S/P aortic valve replacement  S/P tricuspid valve repair  S/P mitral valve repair  S/P CABG x 1  Patient's Home Medications on Admission:  Current Outpatient Prescriptions:  .  acetaminophen (TYLENOL) 500 MG tablet, Take 500 mg by mouth every 8 (eight) hours as needed for pain., Disp: , Rfl:  .  aspirin EC 81 MG tablet, Take 81 mg by mouth daily., Disp: , Rfl:  .  atorvastatin (LIPITOR) 40 MG tablet, Take 40 mg by mouth daily., Disp: , Rfl:  .  cholecalciferol (VITAMIN D) 1000 units tablet, Take 2,000 Units by mouth daily., Disp: , Rfl:  .  diclofenac sodium (VOLTAREN) 1 % GEL, Apply 2 g topically 2 (two) times daily as needed., Disp: , Rfl:  .  digoxin (LANOXIN) 0.25 MG tablet, Take 0.25 mg by mouth daily., Disp: , Rfl:  .  fexofenadine (ALLEGRA) 180 MG tablet, Take 180 mg by mouth every evening., Disp: , Rfl:  .  furosemide (LASIX) 40 MG tablet, Take 40 mg by mouth., Disp: , Rfl:  .  gabapentin (NEURONTIN) 400 MG capsule, Take 400 mg by mouth 3 (three) times daily., Disp: , Rfl:  .  HYDROmorphone (DILAUDID) 2 MG tablet, Take 2 mg by mouth 2 (two) times daily., Disp: , Rfl:  .  levalbuterol (XOPENEX HFA) 45 MCG/ACT inhaler, Inhale 2 puffs into the lungs every 4 (four) hours as needed for wheezing., Disp: , Rfl:  .  metoprolol (LOPRESSOR) 50 MG tablet, Take 50 mg by mouth 2 (two) times daily., Disp: , Rfl:  .  potassium chloride SA (K-DUR,KLOR-CON) 20 MEQ tablet, Take 20 mEq by mouth daily., Disp: , Rfl:  .  predniSONE (DELTASONE) 5 MG tablet, Take 5  mg by mouth daily with breakfast., Disp: , Rfl:  .  warfarin (COUMADIN) 5 MG tablet, Take 5 mg by mouth daily at 6 PM., Disp: , Rfl:   Past Medical History: Past Medical History:  Diagnosis Date  . Atrial fibrillation (HCC)   . Cardiomegaly   . Loeys-Dietz syndrome    type 1  . Shingles   . Thyroid disease    hyperthyroidism    Tobacco Use: History  Smoking Status  . Never Smoker  Smokeless Tobacco  . Not on file    Labs: Recent Review Flowsheet Data    Labs for ITP Cardiac and Pulmonary Rehab Latest Ref Rng & Units 07/23/2011 07/28/2011 06/14/2013   Cholestrol 0 - 200 mg/dL - - 409(W)   LDLCALC 0 - 100 mg/dL - - 119(J)   HDL 40 - 60 mg/dL - - 47(W)   Trlycerides 0 - 200 mg/dL - - 87   PHART 2.956 - 7.400 - 7.468(H) -   PCO2ART 35.0 - 45.0 mmHg - 32.3(L) -   HCO3 20.0 - 24.0 mEq/L - 23.1 -   TCO2 0 - 100 mmol/L 19 20.3 -   O2SAT % - 96.1 -       Exercise Target Goals:    Exercise Program Goal: Individual exercise prescription set with THRR, safety &  activity barriers. Participant demonstrates ability to understand and report RPE using BORG scale, to self-measure pulse accurately, and to acknowledge the importance of the exercise prescription.  Exercise Prescription Goal: Starting with aerobic activity 30 plus minutes a day, 3 days per week for initial exercise prescription. Provide home exercise prescription and guidelines that participant acknowledges understanding prior to discharge.  Activity Barriers & Risk Stratification:     Activity Barriers & Cardiac Risk Stratification - 09/19/16 1536      Activity Barriers & Cardiac Risk Stratification   Activity Barriers Joint Problems;Deconditioning;Muscular Weakness  Has Loeys-Dietz syndrome which may inhibit some exercise levels, has bilateral knee pain   Cardiac Risk Stratification High      6 Minute Walk:     6 Minute Walk    Row Name 09/19/16 1604         6 Minute Walk   Phase Initial     Distance 1185  feet     Walk Time 6 minutes     # of Rest Breaks 0     MPH 2.24     METS 3.47     RPE 11     VO2 Peak 12.13     Symptoms Yes (comment)     Comments knees achy     Resting HR 67 bpm     Resting BP 136/72     Max Ex. HR 96 bpm     Max Ex. BP 138/70     2 Minute Post BP 132/66        Initial Exercise Prescription:     Initial Exercise Prescription - 09/19/16 1600      Date of Initial Exercise RX and Referring Provider   Date 09/19/16   Referring Provider Ward, Cary MD     Treadmill   MPH 1.8   Grade 0.5   Minutes 15   METs 2.5     NuStep   Level 2   Watts --  80-100 spm   Minutes 15   METs 2     Biostep-RELP   Level 2   Watts --  40-50 spm   Minutes 15   METs 2     Prescription Details   Frequency (times per week) 2   Duration Progress to 45 minutes of aerobic exercise without signs/symptoms of physical distress     Intensity   THRR 40-80% of Max Heartrate <100 bpm  Based on [employer] Exercise Guidelines   Ratings of Perceived Exertion 11-15   Perceived Dyspnea 0-4     Progression   Progression Continue to progress workloads to maintain intensity without signs/symptoms of physical distress.     Resistance Training   Training Prescription Yes   Weight 3 lbs   Reps 10-12      Perform Capillary Blood Glucose checks as needed.  Exercise Prescription Changes:     Exercise Prescription Changes    Row Name 09/19/16 1500             Exercise Review   Progression -  Walk Test Results         Response to Exercise   Blood Pressure (Admit) 136/72       Blood Pressure (Exercise) 138/70       Blood Pressure (Exit) 132/66       Heart Rate (Admit) 67 bpm       Heart Rate (Exercise) 96 bpm       Heart Rate (Exit) 69 bpm  Oxygen Saturation (Admit) 99 %       Oxygen Saturation (Exercise) 100 %       Rating of Perceived Exertion (Exercise) 11       Symptoms knees achy          Exercise Comments:     Exercise Comments    Row  Name 09/27/16 0825           Exercise Comments First full day of exercise!  Patient was oriented to gym and equipment including functions, settings, policies, and procedures.  Patient's individual exercise prescription and treatment plan were reviewed.  All starting workloads were established based on the results of the 6 minute walk test done at initial orientation visit.  The plan for exercise progression was also introduced and progression will be customized based on patient's performance and goals.          Discharge Exercise Prescription (Final Exercise Prescription Changes):     Exercise Prescription Changes - 09/19/16 1500      Exercise Review   Progression --  Walk Test Results     Response to Exercise   Blood Pressure (Admit) 136/72   Blood Pressure (Exercise) 138/70   Blood Pressure (Exit) 132/66   Heart Rate (Admit) 67 bpm   Heart Rate (Exercise) 96 bpm   Heart Rate (Exit) 69 bpm   Oxygen Saturation (Admit) 99 %   Oxygen Saturation (Exercise) 100 %   Rating of Perceived Exertion (Exercise) 11   Symptoms knees achy      Nutrition:  Target Goals: Understanding of nutrition guidelines, daily intake of sodium 1500mg , cholesterol 200mg , calories 30% from fat and 7% or less from saturated fats, daily to have 5 or more servings of fruits and vegetables.  Biometrics:    Nutrition Therapy Plan and Nutrition Goals:     Nutrition Therapy & Goals - 09/19/16 1532      Intervention Plan   Intervention Prescribe, educate and counsel regarding individualized specific dietary modifications aiming towards targeted core components such as weight, hypertension, lipid management, diabetes, heart failure and other comorbidities.   Expected Outcomes Short Term Goal: Understand basic principles of dietary content, such as calories, fat, sodium, cholesterol and nutrients.;Short Term Goal: A plan has been developed with personal nutrition goals set during dietitian appointment.;Long  Term Goal: Adherence to prescribed nutrition plan.      Nutrition Discharge: Rate Your Plate Scores:     Nutrition Assessments - 09/19/16 1532      Rate Your Plate Scores   Pre Score 54   Pre Score % 62.1 %      Nutrition Goals Re-Evaluation:   Psychosocial: Target Goals: Acknowledge presence or absence of depression, maximize coping skills, provide positive support system. Participant is able to verbalize types and ability to use techniques and skills needed for reducing stress and depression.  Initial Review & Psychosocial Screening:     Initial Psych Review & Screening - 09/19/16 1532      Initial Review   Current issues with Current Stress Concerns   Source of Stress Concerns Chronic Illness;Financial  Not working presently, plans to apply for disability   Comments Single mother with 3 children ages 6110,10 and 8 at home.  Mother lives with her too. One child with austism.     Family Dynamics   Good Support System? Yes  [place] ,[place]     Barriers   Psychosocial barriers to participate in program There are no identifiable barriers or  psychosocial needs.;The patient should benefit from training in stress management and relaxation.     Screening Interventions   Interventions Encouraged to exercise      Quality of Life Scores:     Quality of Life - 09/19/16 1530      Quality of Life Scores   Health/Function Pre 27 %   Socioeconomic Pre 22.57 %   Psych/Spiritual Pre 26.57 %   Family Pre 30 %   GLOBAL Pre 26.27 %      PHQ-9: Recent Review Flowsheet Data    Depression screen PHQ 2/9 09/19/2016   Decreased Interest 1   Down, Depressed, Hopeless 1   PHQ - 2 Score 2   Altered sleeping 3   Tired, decreased energy 3    Change in appetite 0    Feeling bad or failure about yourself  0   Trouble concentrating 0   Moving slowly or fidgety/restless 0   Suicidal thoughts 0   PHQ-9 Score 8   Difficult doing work/chores Not difficult at  all      Psychosocial Evaluation and Intervention:   Psychosocial Re-Evaluation:   Vocational Rehabilitation: Provide vocational rehab assistance to qualifying candidates.   Vocational Rehab Evaluation & Intervention:     Vocational Rehab - 09/19/16 1530      Initial Vocational Rehab Evaluation & Intervention   Assessment shows need for Vocational Rehabilitation No      Education: Education Goals: Education classes will be provided on a weekly basis, covering required topics. Participant will state understanding/return demonstration of topics presented.  Learning Barriers/Preferences:     Learning Barriers/Preferences - 09/19/16 1538      Learning Barriers/Preferences   Learning Barriers None   Learning Preferences None      Education Topics: General Nutrition Guidelines/Fats and Fiber: -Group instruction provided by verbal, written material, models and posters to present the general guidelines for heart healthy nutrition. Gives an explanation and review of dietary fats and fiber.   Controlling Sodium/Reading Food Labels: -Group verbal and written material supporting the discussion of sodium use in heart healthy nutrition. Review and explanation with models, verbal and written materials for utilization of the food label.   Exercise Physiology & Risk Factors: - Group verbal and written instruction with models to review the exercise physiology of the cardiovascular system and associated critical values. Details cardiovascular disease risk factors and the goals associated with each risk factor.   Aerobic Exercise & Resistance Training: - Gives group verbal and written discussion on the health impact of inactivity. On the components of aerobic and resistive training programs and the benefits of this training and how to safely progress through these programs.   Flexibility, Balance, General Exercise Guidelines: - Provides group verbal and written instruction on the  benefits of flexibility and balance training programs. Provides general exercise guidelines with specific guidelines to those with heart or lung disease. Demonstration and skill practice provided.   Stress Management: - Provides group verbal and written instruction about the health risks of elevated stress, cause of high stress, and healthy ways to reduce stress.   Depression: - Provides group verbal and written instruction on the correlation between heart/lung disease and depressed mood, treatment options, and the stigmas associated with seeking treatment.   Anatomy & Physiology of the Heart: - Group verbal and written instruction and models provide basic cardiac anatomy and physiology, with the coronary electrical and arterial systems. Review of: AMI, Angina, Valve disease, Heart Failure, Cardiac Arrhythmia, Pacemakers, and the ICD.  Cardiac Procedures: - Group verbal and written instruction and models to describe the testing methods done to diagnose heart disease. Reviews the outcomes of the test results. Describes the treatment choices: Medical Management, Angioplasty, or Coronary Bypass Surgery.   Cardiac Medications: - Group verbal and written instruction to review commonly prescribed medications for heart disease. Reviews the medication, class of the drug, and side effects. Includes the steps to properly store meds and maintain the prescription regimen.   Go Sex-Intimacy & Heart Disease, Get SMART - Goal Setting: - Group verbal and written instruction through game format to discuss heart disease and the return to sexual intimacy. Provides group verbal and written material to discuss and apply goal setting through the application of the S.M.A.R.T. Method.   Other Matters of the Heart: - Provides group verbal, written materials and models to describe Heart Failure, Angina, Valve Disease, and Diabetes in the realm of heart disease. Includes description of the disease process and  treatment options available to the cardiac patient.   Exercise & Equipment Safety: - Individual verbal instruction and demonstration of equipment use and safety with use of the equipment. Flowsheet Row Cardiac Rehab from 09/27/2016 in [facility] Cardiac and Pulmonary Rehab  Date  09/19/16  Educator  SB  Instruction Review Code  2- meets goals/outcomes      Infection Prevention: - Provides verbal and written material to individual with discussion of infection control including proper hand washing and proper equipment cleaning during exercise session. Flowsheet Row Cardiac Rehab from 09/27/2016 in [facility] Cardiac and Pulmonary Rehab  Date  09/19/16  Educator  SB  Instruction Review Code  2- meets goals/outcomes      Falls Prevention: - Provides verbal and written material to individual with discussion of falls prevention and safety.   Diabetes: - Individual verbal and written instruction to review signs/symptoms of diabetes, desired ranges of glucose level fasting, after meals and with exercise. Advice that pre and post exercise glucose checks will be done for 3 sessions at entry of program.    Knowledge Questionnaire Score:     Knowledge Questionnaire Score - 09/19/16 1530      Knowledge Questionnaire Score   Pre Score 24/28      Core Components/Risk Factors/Patient Goals at Admission:     Personal Goals and Risk Factors at Admission - 09/19/16 1547      Core Components/Risk Factors/Patient Goals on Admission    Weight Management Obesity;Weight Loss;Yes   Intervention Weight Management: Develop a combined nutrition and exercise program designed to reach desired caloric intake, while maintaining appropriate intake of nutrient and fiber, sodium and fats, and appropriate energy expenditure required for the weight goal.;Weight Management: Provide education and appropriate resources to help participant work on and attain dietary goals.;Weight Management/Obesity: Establish reasonable  short term and long term weight goals.;Obesity: Provide education and appropriate resources to help participant work on and attain dietary goals.   Admit Weight 237 lb 4.8 oz (107.6 kg)   Goal Weight: Short Term 234 lb (106.1 kg)   Goal Weight: Long Term 180 lb (81.6 kg)   Expected Outcomes Short Term: Continue to assess and modify interventions until short term weight is achieved;Long Term: Adherence to nutrition and physical activity/exercise program aimed toward attainment of established weight goal;Weight Loss: Understanding of general recommendations for a balanced deficit meal plan, which promotes 1-2 lb weight loss per week and includes a negative energy balance of [phone] kcal/d   Sedentary Yes   Intervention Provide advice, education, support and  counseling about physical activity/exercise needs.;Develop an individualized exercise prescription for aerobic and resistive training based on initial evaluation findings, risk stratification, comorbidities and participant's personal goals.   Expected Outcomes Achievement of increased cardiorespiratory fitness and enhanced flexibility, muscular endurance and strength shown through measurements of functional capacity and personal statement of participant.   Increase Strength and Stamina Yes   Intervention Provide advice, education, support and counseling about physical activity/exercise needs.;Develop an individualized exercise prescription for aerobic and resistive training based on initial evaluation findings, risk stratification, comorbidities and participant's personal goals.   Expected Outcomes Achievement of increased cardiorespiratory fitness and enhanced flexibility, muscular endurance and strength shown through measurements of functional capacity and personal statement of participant.   Lipids Yes   Intervention Provide education and support for participant on nutrition & aerobic/resistive exercise along with prescribed medications to achieve  LDL 70mg , HDL >40mg .   Expected Outcomes Short Term: Participant states understanding of desired cholesterol values and is compliant with medications prescribed. Participant is following exercise prescription and nutrition guidelines.;Long Term: Cholesterol controlled with medications as prescribed, with individualized exercise RX and with personalized nutrition plan. Value goals: LDL < 70mg , HDL > 40 mg.      Core Components/Risk Factors/Patient Goals Review:    Core Components/Risk Factors/Patient Goals at Discharge (Final Review):    ITP Comments:     ITP Comments    Row Name 09/19/16 1540 09/28/16 0700         ITP Comments Medical review completed. Intitial ITP created.  Documentation of Diagnosis can be found in CARE EVERYWHERE [facility] Encounter 01/05/2016 30 day review completed for Medical Director physician review and signature. Continue ITP unless changes made by physician.New to program         Comments:

## 2016-09-29 DIAGNOSIS — Z951 Presence of aortocoronary bypass graft: Secondary | ICD-10-CM

## 2016-09-29 DIAGNOSIS — Z952 Presence of prosthetic heart valve: Secondary | ICD-10-CM

## 2016-09-29 DIAGNOSIS — Z9889 Other specified postprocedural states: Secondary | ICD-10-CM

## 2016-09-29 NOTE — Progress Notes (Signed)
Daily Session Note  Patient Details  Name: Brittany Newton MRN: [id number] Date of Birth: August 27, 1961 Referring Provider:   Flowsheet Row Cardiac Rehab from 09/19/2016 in [facility] Cardiac and Pulmonary Rehab  Referring Provider  Ward, Jeani Hawking MD      Encounter Date: 09/29/2016  Check In:     Session Check In - 09/29/16 0842      Check-In   Location [facility]-Cardiac & Pulmonary Rehab   Staff Present Gerlene Burdock, RN, BSN;Jessica Luan Pulling, MA, ACSM RCEP, Exercise Physiologist;Rachard Isidro Oletta Darter, BA, ACSM CEP, Exercise Physiologist   Supervising physician immediately available to respond to emergencies See telemetry face sheet for immediately available ER MD   Medication changes reported     No   Fall or balance concerns reported    No   Warm-up and Cool-down Performed on first and last piece of equipment   Resistance Training Performed Yes   VAD Patient? No     Pain Assessment   Currently in Pain? No/denies   Multiple Pain Sites No         Goals Met:  Independence with exercise equipment Exercise tolerated well No report of cardiac concerns or symptoms Strength training completed today  Goals Unmet:  Not Applicable  Comments: Pt able to follow exercise prescription today without complaint.  Will continue to monitor for progression.    Dr. Emily Filbert is Medical Director for [place] and [facility].

## 2016-10-04 ENCOUNTER — Encounter: Payer: Medicaid Other | Admitting: *Deleted

## 2016-10-04 DIAGNOSIS — Z951 Presence of aortocoronary bypass graft: Secondary | ICD-10-CM | POA: Diagnosis not present

## 2016-10-04 DIAGNOSIS — Z952 Presence of prosthetic heart valve: Secondary | ICD-10-CM

## 2016-10-04 DIAGNOSIS — Z9889 Other specified postprocedural states: Secondary | ICD-10-CM

## 2016-10-04 NOTE — Progress Notes (Addendum)
Daily Session Note  Patient Details  Name: Brittany Newton MRN: [id number] Date of Birth: 1961-05-06 Referring Provider:   Flowsheet Row Cardiac Rehab from 09/19/2016 in [facility] Cardiac and Pulmonary Rehab  Referring Provider  Ward, Jeani Hawking MD      Encounter Date: 10/04/2016  Check In:     Session Check In - 10/04/16 0824      Check-In   Location [facility]-Cardiac & Pulmonary Rehab   Staff Present Alberteen Sam, MA, ACSM RCEP, Exercise Physiologist;Susanne Bice, RN, BSN, CCRP;Laureen Owens Shark, BS, RRT, Respiratory Therapist   Supervising physician immediately available to respond to emergencies See telemetry face sheet for immediately available ER MD   Medication changes reported     No   Fall or balance concerns reported    No   Warm-up and Cool-down Performed on first and last piece of equipment   Resistance Training Performed Yes   VAD Patient? No     Pain Assessment   Currently in Pain? No/denies   Multiple Pain Sites No         Goals Met:  Independence with exercise equipment Exercise tolerated well No report of cardiac concerns or symptoms Strength training completed today  Goals Unmet:  Not Applicable  Comments: Pt able to follow exercise prescription today without complaint.  Will continue to monitor for progression. Reviewed home exercise with pt today.  Pt plans to walk at home and at [facility] for exercise.  Reviewed THR, pulse, RPE, sign and symptoms, and when to call 911 or MD.  Also discussed weather considerations and indoor options.  Pt voiced understanding. Alberteen Sam, MA, ACSM RCEP 10/04/2016 10:51 AM   Dr. Emily Filbert is Medical Director for [place] and [facility].

## 2016-10-06 ENCOUNTER — Encounter: Payer: Medicaid Other | Admitting: *Deleted

## 2016-10-06 DIAGNOSIS — Z952 Presence of prosthetic heart valve: Secondary | ICD-10-CM

## 2016-10-06 DIAGNOSIS — Z9889 Other specified postprocedural states: Secondary | ICD-10-CM

## 2016-10-06 DIAGNOSIS — Z951 Presence of aortocoronary bypass graft: Secondary | ICD-10-CM

## 2016-10-06 NOTE — Progress Notes (Signed)
Daily Session Note  Patient Details  Name: Brittany Newton MRN: [id number] Date of Birth: 10-Feb-1961 Referring Provider:   Flowsheet Row Cardiac Rehab from 09/19/2016 in [facility] Cardiac and Pulmonary Rehab  Referring Provider  Ward, Jeani Hawking MD      Encounter Date: 10/06/2016  Check In:     Session Check In - 10/06/16 0830      Check-In   Location [facility]-Cardiac & Pulmonary Rehab   Staff Present Alberteen Sam, MA, ACSM RCEP, Exercise Physiologist;Carroll Enterkin, RN, Vickki Hearing, BA, ACSM CEP, Exercise Physiologist;Other   Supervising physician immediately available to respond to emergencies See telemetry face sheet for immediately available ER MD   Medication changes reported     No   Fall or balance concerns reported    No   Warm-up and Cool-down Performed on first and last piece of equipment   Resistance Training Performed Yes   VAD Patient? No     Pain Assessment   Currently in Pain? No/denies   Multiple Pain Sites No         Goals Met:  Independence with exercise equipment Exercise tolerated well No report of cardiac concerns or symptoms Strength training completed today  Goals Unmet:  Not Applicable  Comments: Pt able to follow exercise prescription today without complaint.  Will continue to monitor for progression.    Dr. Emily Filbert is Medical Director for [place] and [facility].

## 2016-10-11 ENCOUNTER — Encounter: Payer: Medicaid Other | Admitting: *Deleted

## 2016-10-11 DIAGNOSIS — Z951 Presence of aortocoronary bypass graft: Secondary | ICD-10-CM

## 2016-10-11 DIAGNOSIS — Z9889 Other specified postprocedural states: Secondary | ICD-10-CM

## 2016-10-11 DIAGNOSIS — Z952 Presence of prosthetic heart valve: Secondary | ICD-10-CM

## 2016-10-11 NOTE — Progress Notes (Signed)
Daily Session Note  Patient Details  Name: Brittany Newton MRN: [id number] Date of Birth: 1961-01-09 Referring Provider:   Flowsheet Row Cardiac Rehab from 09/19/2016 in [facility] Cardiac and Pulmonary Rehab  Referring Provider  Ward, Jeani Hawking MD      Encounter Date: 10/11/2016  Check In:     Session Check In - 10/11/16 0829      Check-In   Location [facility]-Cardiac & Pulmonary Rehab   Staff Present Alberteen Sam, MA, ACSM RCEP, Exercise Physiologist;Susanne Bice, RN, BSN, CCRP;Laureen Owens Shark, BS, RRT, Respiratory Therapist   Supervising physician immediately available to respond to emergencies See telemetry face sheet for immediately available ER MD   Medication changes reported     No   Fall or balance concerns reported    No   Warm-up and Cool-down Performed as group-led instruction   Resistance Training Performed Yes   VAD Patient? No     Pain Assessment   Currently in Pain? No/denies   Multiple Pain Sites No         Goals Met:  Independence with exercise equipment Exercise tolerated well No report of cardiac concerns or symptoms Strength training completed today  Goals Unmet:  Not Applicable  Comments: Pt able to follow exercise prescription today without complaint.  Will continue to monitor for progression.    Dr. Emily Filbert is Medical Director for [facility] and [facility].

## 2016-10-18 ENCOUNTER — Encounter: Payer: Medicaid Other | Admitting: *Deleted

## 2016-10-18 DIAGNOSIS — Z951 Presence of aortocoronary bypass graft: Secondary | ICD-10-CM

## 2016-10-18 DIAGNOSIS — Z952 Presence of prosthetic heart valve: Secondary | ICD-10-CM

## 2016-10-18 DIAGNOSIS — Z9889 Other specified postprocedural states: Secondary | ICD-10-CM

## 2016-10-18 NOTE — Progress Notes (Signed)
Daily Session Note  Patient Details  Name: Alayzha An MRN: [id number] Date of Birth: [birth year] Referring Provider:   Flowsheet Row Cardiac Rehab from 09/19/2016 in [facility] Cardiac and Pulmonary Rehab  Referring Provider  Ward, Jeani Hawking MD      Encounter Date: 10/18/2016  Check In:     Session Check In - 10/18/16 0844      Check-In   Location [facility]-Cardiac & Pulmonary Rehab   Staff Present Alberteen Sam, MA, ACSM RCEP, Exercise Physiologist;Susanne Bice, RN, BSN, CCRP;Laureen Owens Shark, BS, RRT, Respiratory Therapist   Supervising physician immediately available to respond to emergencies See telemetry face sheet for immediately available ER MD   Medication changes reported     No   Fall or balance concerns reported    No   Warm-up and Cool-down Performed on first and last piece of equipment   Resistance Training Performed Yes   VAD Patient? No     Pain Assessment   Currently in Pain? No/denies         Goals Met:  Independence with exercise equipment Exercise tolerated well No report of cardiac concerns or symptoms Strength training completed today  Goals Unmet:  Not Applicable  Comments: Pt able to follow exercise prescription today without complaint.  Will continue to monitor for progression.    Dr. Emily Filbert is Medical Director for [facility] and [facility].

## 2016-10-20 DIAGNOSIS — Z951 Presence of aortocoronary bypass graft: Secondary | ICD-10-CM | POA: Diagnosis not present

## 2016-10-20 DIAGNOSIS — Z9889 Other specified postprocedural states: Secondary | ICD-10-CM

## 2016-10-20 DIAGNOSIS — Z952 Presence of prosthetic heart valve: Secondary | ICD-10-CM

## 2016-10-20 NOTE — Progress Notes (Signed)
Daily Session Note  Patient Details  Name: Brittany Newton MRN: [id number] Date of Birth: Mar 12, 1961 Referring Provider:   Flowsheet Row Cardiac Rehab from 09/19/2016 in [facility] Cardiac and Pulmonary Rehab  Referring Provider  Ward, Jeani Hawking MD      Encounter Date: 10/20/2016  Check In:     Session Check In - 10/20/16 0831      Check-In   Location [facility]-Cardiac & Pulmonary Rehab   Staff Present Alberteen Sam, MA, ACSM RCEP, Exercise Physiologist;Amanda Oletta Darter, [place], ACSM CEP, Exercise Physiologist   Supervising physician immediately available to respond to emergencies See telemetry face sheet for immediately available ER MD   Medication changes reported     No   Fall or balance concerns reported    No   Warm-up and Cool-down Performed on first and last piece of equipment   Resistance Training Performed Yes   VAD Patient? No     Pain Assessment   Currently in Pain? No/denies   Multiple Pain Sites No           Exercise Prescription Changes - 10/19/16 1500      Exercise Review   Progression Yes     Response to Exercise   Blood Pressure (Admit) 98/50   Blood Pressure (Exercise) 126/72   Blood Pressure (Exit) 126/62   Heart Rate (Admit) 97 bpm   Heart Rate (Exercise) 101 bpm   Heart Rate (Exit) 85 bpm   Rating of Perceived Exertion (Exercise) 15   Symptoms none   Comments Home Exercise Guidelines given 10/04/16   Duration Progress to 45 minutes of aerobic exercise without signs/symptoms of physical distress   Intensity THRR unchanged     Progression   Progression Continue to progress workloads to maintain intensity without signs/symptoms of physical distress.   Average METs 3     Resistance Training   Training Prescription Yes   Weight 3 lbs   Reps 10-12     Interval Training   Interval Training No     Treadmill   MPH 2.2   Grade 1.5   Minutes 15   METs 2.99     NuStep   Level 2   Minutes 15   METs 2     Biostep-RELP   Level 2   Minutes 15   METs 4     Home Exercise Plan   Plans to continue exercise at Home  walking at home and [facility]   Frequency Add 3 additional days to program exercise sessions.      Goals Met:  Independence with exercise equipment Exercise tolerated well Personal goals reviewed No report of cardiac concerns or symptoms Strength training completed today  Goals Unmet:  Not Applicable  Comments: Pt able to follow exercise prescription today without complaint.  Will continue to monitor for progression.\   Dr. Emily Filbert is Medical Director for [facility] and [facility].

## 2016-10-25 ENCOUNTER — Telehealth: Payer: Self-pay

## 2016-10-25 ENCOUNTER — Encounter: Payer: Medicaid Other | Attending: Internal Medicine | Admitting: Respiratory Therapy

## 2016-10-25 DIAGNOSIS — Z9889 Other specified postprocedural states: Secondary | ICD-10-CM

## 2016-10-25 DIAGNOSIS — Z951 Presence of aortocoronary bypass graft: Secondary | ICD-10-CM | POA: Insufficient documentation

## 2016-10-25 DIAGNOSIS — Z954 Presence of other heart-valve replacement: Secondary | ICD-10-CM | POA: Diagnosis present

## 2016-10-25 DIAGNOSIS — Z952 Presence of prosthetic heart valve: Secondary | ICD-10-CM

## 2016-10-25 DIAGNOSIS — I5022 Chronic systolic (congestive) heart failure: Secondary | ICD-10-CM | POA: Insufficient documentation

## 2016-10-25 NOTE — Telephone Encounter (Signed)
Spoke with Janann AugustHelene about the request for [employer] charitable foundation request for shoes.  It is approved and [employer] is calling [employer] to let them know they will pay for the shoes Janann AugustHelene chooses.  [place] and will pick her shoes up then.

## 2016-10-25 NOTE — Progress Notes (Signed)
Daily Session Note  Patient Details  Name: Genoa Freyre MRN: [id number] Date of Birth: Feb 06, 1961 Referring Provider:   Flowsheet Row Cardiac Rehab from 09/19/2016 in [facility] Cardiac and Pulmonary Rehab  Referring Provider  Ward, Jeani Hawking MD      Encounter Date: 10/25/2016  Check In:     Session Check In - 10/25/16 0854      Check-In   Location [facility]-Cardiac & Pulmonary Rehab   Staff Present Alberteen Sam, MA, ACSM RCEP, Exercise Physiologist;Susanne Bice, RN, BSN, CCRP;Laureen Owens Shark, BS, RRT, Respiratory Therapist   Supervising physician immediately available to respond to emergencies See telemetry face sheet for immediately available ER MD   Medication changes reported     No   Fall or balance concerns reported    No   Warm-up and Cool-down Performed on first and last piece of equipment   Resistance Training Performed Yes   VAD Patient? No     Pain Assessment   Currently in Pain? No/denies         Goals Met:  Independence with exercise equipment Exercise tolerated well No report of cardiac concerns or symptoms Strength training completed today  Goals Unmet:  Not Applicable  Comments: Pt able to follow exercise prescription today without complaint.  Will continue to monitor for progression.   Kemaya will going to [employer] for a research study next week.   Dr. Emily Filbert is Medical Director for [facility].

## 2016-10-26 ENCOUNTER — Encounter: Payer: Self-pay | Admitting: *Deleted

## 2016-10-26 DIAGNOSIS — Z951 Presence of aortocoronary bypass graft: Secondary | ICD-10-CM

## 2016-10-26 DIAGNOSIS — Z952 Presence of prosthetic heart valve: Secondary | ICD-10-CM

## 2016-10-26 NOTE — Progress Notes (Signed)
Cardiac Individual Treatment Plan  Patient Details  Name: Conner Neiss MRN: [id number] Date of Birth: 13-Apr-1961 Referring Provider:   Flowsheet Row Cardiac Rehab from 09/19/2016 in [facility] Cardiac and Pulmonary Rehab  Referring Provider  Ward, Georga Hacking MD      Initial Encounter Date:  Flowsheet Row Cardiac Rehab from 09/19/2016 in [facility] Cardiac and Pulmonary Rehab  Date  09/19/16  Referring Provider  Ward, Georga Hacking MD      Visit Diagnosis: S/P aortic valve replacement  S/P CABG x 1  Patient's Home Medications on Admission:  Current Outpatient Prescriptions:  .  acetaminophen (TYLENOL) 500 MG tablet, Take 500 mg by mouth every 8 (eight) hours as needed for pain., Disp: , Rfl:  .  aspirin EC 81 MG tablet, Take 81 mg by mouth daily., Disp: , Rfl:  .  atorvastatin (LIPITOR) 40 MG tablet, Take 40 mg by mouth daily., Disp: , Rfl:  .  cholecalciferol (VITAMIN D) 1000 units tablet, Take 2,000 Units by mouth daily., Disp: , Rfl:  .  diclofenac sodium (VOLTAREN) 1 % GEL, Apply 2 g topically 2 (two) times daily as needed., Disp: , Rfl:  .  digoxin (LANOXIN) 0.25 MG tablet, Take 0.25 mg by mouth daily., Disp: , Rfl:  .  fexofenadine (ALLEGRA) 180 MG tablet, Take 180 mg by mouth every evening., Disp: , Rfl:  .  furosemide (LASIX) 40 MG tablet, Take 40 mg by mouth., Disp: , Rfl:  .  gabapentin (NEURONTIN) 400 MG capsule, Take 400 mg by mouth 3 (three) times daily., Disp: , Rfl:  .  HYDROmorphone (DILAUDID) 2 MG tablet, Take 2 mg by mouth 2 (two) times daily., Disp: , Rfl:  .  levalbuterol (XOPENEX HFA) 45 MCG/ACT inhaler, Inhale 2 puffs into the lungs every 4 (four) hours as needed for wheezing., Disp: , Rfl:  .  metoprolol (LOPRESSOR) 50 MG tablet, Take 50 mg by mouth 2 (two) times daily., Disp: , Rfl:  .  potassium chloride SA (K-DUR,KLOR-CON) 20 MEQ tablet, Take 20 mEq by mouth daily., Disp: , Rfl:  .  predniSONE (DELTASONE) 5 MG tablet, Take 5 mg by mouth daily with breakfast., Disp: , Rfl:  .   warfarin (COUMADIN) 5 MG tablet, Take 5 mg by mouth daily at 6 PM., Disp: , Rfl:   Past Medical History: Past Medical History:  Diagnosis Date  . Atrial fibrillation (HCC)   . Cardiomegaly   . Loeys-Dietz syndrome    type 1  . Shingles   . Thyroid disease    hyperthyroidism    Tobacco Use: History  Smoking Status  . Never Smoker  Smokeless Tobacco  . Not on file    Labs: Recent Review Flowsheet Data    Labs for ITP Cardiac and Pulmonary Rehab Latest Ref Rng & Units 07/23/2011 07/28/2011 06/14/2013   Cholestrol 0 - 200 mg/dL - - 409(W)   LDLCALC 0 - 100 mg/dL - - 119(J)   HDL 40 - 60 mg/dL - - 47(W)   Trlycerides 0 - 200 mg/dL - - 87   PHART 2.956 - 7.400 - 7.468(H) -   PCO2ART 35.0 - 45.0 mmHg - 32.3(L) -   HCO3 20.0 - 24.0 mEq/L - 23.1 -   TCO2 0 - 100 mmol/L 19 20.3 -   O2SAT % - 96.1 -       Exercise Target Goals:    Exercise Program Goal: Individual exercise prescription set with THRR, safety & activity barriers. Participant demonstrates ability to understand and report RPE  using BORG scale, to self-measure pulse accurately, and to acknowledge the importance of the exercise prescription.  Exercise Prescription Goal: Starting with aerobic activity 30 plus minutes a day, 3 days per week for initial exercise prescription. Provide home exercise prescription and guidelines that participant acknowledges understanding prior to discharge.  Activity Barriers & Risk Stratification:     Activity Barriers & Cardiac Risk Stratification - 09/19/16 1536      Activity Barriers & Cardiac Risk Stratification   Activity Barriers Joint Problems;Deconditioning;Muscular Weakness  Has Loeys-Dietz syndrome which may inhibit some exercise levels, has bilateral knee pain   Cardiac Risk Stratification High      6 Minute Walk:     6 Minute Walk    Row Name 09/19/16 1604         6 Minute Walk   Phase Initial     Distance 1185 feet     Walk Time 6 minutes     # of Rest Breaks  0     MPH 2.24     METS 3.47     RPE 11     VO2 Peak 12.13     Symptoms Yes (comment)     Comments knees achy     Resting HR 67 bpm     Resting BP 136/72     Max Ex. HR 96 bpm     Max Ex. BP 138/70     2 Minute Post BP 132/66        Initial Exercise Prescription:     Initial Exercise Prescription - 09/19/16 1600      Date of Initial Exercise RX and Referring Provider   Date 09/19/16   Referring Provider Ward, Cary MD     Treadmill   MPH 1.8   Grade 0.5   Minutes 15   METs 2.5     NuStep   Level 2   Watts --  80-100 spm   Minutes 15   METs 2     Biostep-RELP   Level 2   Watts --  40-50 spm   Minutes 15   METs 2     Prescription Details   Frequency (times per week) 2   Duration Progress to 45 minutes of aerobic exercise without signs/symptoms of physical distress     Intensity   THRR 40-80% of Max Heartrate <100 bpm  Based on [employer] Exercise Guidelines   Ratings of Perceived Exertion 11-15   Perceived Dyspnea 0-4     Progression   Progression Continue to progress workloads to maintain intensity without signs/symptoms of physical distress.     Resistance Training   Training Prescription Yes   Weight 3 lbs   Reps 10-12      Perform Capillary Blood Glucose checks as needed.  Exercise Prescription Changes:     Exercise Prescription Changes    Row Name 09/19/16 1500 10/04/16 1000 10/04/16 1600 10/19/16 1500       Exercise Review   Progression -  Walk Test Results  - Yes Yes      Response to Exercise   Blood Pressure (Admit) 136/72  - 126/64 98/50    Blood Pressure (Exercise) 138/70  - 124/64 126/72    Blood Pressure (Exit) 132/66  - 100/62 126/62    Heart Rate (Admit) 67 bpm  - 67 bpm 97 bpm    Heart Rate (Exercise) 96 bpm  - 101 bpm 101 bpm    Heart Rate (Exit) 69 bpm  - 84 bpm 85  bpm    Oxygen Saturation (Admit) 99 %  -  -  -    Oxygen Saturation (Exercise) 100 %  -  -  -    Rating of Perceived Exertion (Exercise) 11  - 11  15    Symptoms knees achy  - knee slips occasionally none    Comments  - Home Exercise Guidelines given 10/04/16 Home Exercise Guidelines given 10/04/16 Home Exercise Guidelines given 10/04/16    Duration  -  - Progress to 45 minutes of aerobic exercise without signs/symptoms of physical distress Progress to 45 minutes of aerobic exercise without signs/symptoms of physical distress    Intensity  -  - THRR unchanged THRR unchanged      Progression   Progression  -  - Continue to progress workloads to maintain intensity without signs/symptoms of physical distress. Continue to progress workloads to maintain intensity without signs/symptoms of physical distress.    Average METs  -  - 2.14 3      Resistance Training   Training Prescription  -  - Yes Yes    Weight  -  - 3 lbs 3 lbs    Reps  -  - 10-12 10-12      Interval Training   Interval Training  -  - No No      Treadmill   MPH  -  - 1.8 2.2    Grade  -  - 0.5 1.5    Minutes  -  - 15 15    METs  -  - 2.63 2.99      NuStep   Level  -  - 2 2    Minutes  -  - 15 15    METs  -  - 1.8 2      Biostep-RELP   Level  -  - 2 2    Minutes  -  - 15 15    METs  -  - 2 4      Home Exercise Plan   Plans to continue exercise at  - Home  walking at home and [facility] Home  [facility]ing at [facility] and [facility]  walking at home and [facility]    Frequency  - Add 3 additional days to program exercise sessions. Add 3 additional days to program exercise sessions. Add 3 additional days to program exercise sessions.       Exercise Comments:     Exercise Comments    Row Name 09/27/16 0825 10/04/16 1052 10/04/16 1601 10/07/16 0900 10/19/16 1523   Exercise Comments First full day of exercise!  Patient was oriented to gym and equipment including functions, settings, policies, and procedures.  Patient's individual exercise prescription and treatment plan were reviewed.  All starting workloads were established based on the results of the 6 minute walk test  done at initial orientation visit.  The plan for exercise progression was also introduced and progression will be customized based on patient's performance and goals. Reviewed home exercise with pt today.  [facility]  Reviewed THR, pulse, RPE, sign and symptoms, and when to call 911 or MD.  Also discussed weather considerations and indoor options.  Pt voiced understanding. Janann AugustHelene is off to a good start with rehab.  She is posting on boards about her exercise in her online support groups.  She is so excited!  We will continue to monitor her progression. I spoke with Janann AugustHelene about getting good supportive  shoes to help decrease pain from connective tissue disorder.  She is going to have someone analyze her gait and help her find appropriate shoes. Abril is doing great in rehab.  She is starting to make some progress and stay under her THR.  She has looked into new shoes and is now just waiting approval for them to be purcharsed through the [facility].  We will continue to monitor her progression.   Row Name 10/20/16 1104           Exercise Comments Reviewed METs and progression today.          Discharge Exercise Prescription (Final Exercise Prescription Changes):     Exercise Prescription Changes - 10/19/16 1500      Exercise Review   Progression Yes     Response to Exercise   Blood Pressure (Admit) 98/50   Blood Pressure (Exercise) 126/72   Blood Pressure (Exit) 126/62   Heart Rate (Admit) 97 bpm   Heart Rate (Exercise) 101 bpm   Heart Rate (Exit) 85 bpm   Rating of Perceived Exertion (Exercise) 15   Symptoms none   Comments Home Exercise Guidelines given 10/04/16   Duration Progress to 45 minutes of aerobic exercise without signs/symptoms of physical distress   Intensity THRR unchanged     Progression   Progression Continue to progress workloads to maintain intensity without signs/symptoms of physical distress.   Average METs 3      Resistance Training   Training Prescription Yes   Weight 3 lbs   Reps 10-12     Interval Training   Interval Training No     Treadmill   MPH 2.2   Grade 1.5   Minutes 15   METs 2.99     NuStep   Level 2   Minutes 15   METs 2     Biostep-RELP   Level 2   Minutes 15   METs 4     Home Exercise Plan   Plans to continue exercise at Home  walking at home and [employer]   Frequency Add 3 additional days to program exercise sessions.      Nutrition:  Target Goals: Understanding of nutrition guidelines, daily intake of sodium 1500mg , cholesterol 200mg , calories 30% from fat and 7% or less from saturated fats, daily to have 5 or more servings of fruits and vegetables.  Biometrics:    Nutrition Therapy Plan and Nutrition Goals:     Nutrition Therapy & Goals - 09/19/16 1532      Intervention Plan   Intervention Prescribe, educate and counsel regarding individualized specific dietary modifications aiming towards targeted core components such as weight, hypertension, lipid management, diabetes, heart failure and other comorbidities.   Expected Outcomes Short Term Goal: Understand basic principles of dietary content, such as calories, fat, sodium, cholesterol and nutrients.;Short Term Goal: A plan has been developed with personal nutrition goals set during dietitian appointment.;Long Term Goal: Adherence to prescribed nutrition plan.      Nutrition Discharge: Rate Your Plate Scores:     Nutrition Assessments - 09/19/16 1532      Rate Your Plate Scores   Pre Score 54   Pre Score % 62.1 %      Nutrition Goals Re-Evaluation:     Nutrition Goals Re-Evaluation    Row Name 10/20/16 1100             Weight   Current Weight 233 lb 14.4 oz (106.1 kg)  Intervention Plan   Intervention Nutrition handout(s) given to patient.       Comments Janann AugustHelene declined an appointment with dietician.  She says the classes helped and she is making good decisions.           Psychosocial: Target Goals: Acknowledge presence or absence of depression, maximize coping skills, provide positive support system. Participant is able to verbalize types and ability to use techniques and skills needed for reducing stress and depression.  Initial Review & Psychosocial Screening:     Initial Psych Review & Screening - 09/19/16 1532      Initial Review   Current issues with Current Stress Concerns   Source of Stress Concerns Chronic Illness;Financial  Not working presently, plans to apply for disability   Comments Single mother with 3 children ages 2410,10 and 8 at home.  Mother lives with her too. One child with austism.     Family Dynamics   Good Support System? Yes  Mother ,daughter [place] live in [place]     Barriers   Psychosocial barriers to participate in program There are no identifiable barriers or psychosocial needs.;The patient should benefit from training in stress management and relaxation.     Screening Interventions   Interventions Encouraged to exercise      Quality of Life Scores:     Quality of Life - 09/19/16 1530      Quality of Life Scores   Health/Function Pre 27 %   Socioeconomic Pre 22.57 %   Psych/Spiritual Pre 26.57 %   Family Pre 30 %   GLOBAL Pre 26.27 %      PHQ-9: Recent Review Flowsheet Data    Depression screen PHQ 2/9 09/19/2016   Decreased Interest 1   Down, Depressed, Hopeless 1   PHQ - 2 Score 2   Altered sleeping 3   Tired, decreased energy 3    Change in appetite 0    Feeling bad or failure about yourself  0   Trouble concentrating 0   Moving slowly or fidgety/restless 0   Suicidal thoughts 0   PHQ-9 Score 8   Difficult doing work/chores Not difficult at all      Psychosocial Evaluation and Intervention:   Psychosocial Re-Evaluation:     Psychosocial Re-Evaluation    Row Name 10/20/16 1101             Psychosocial Re-Evaluation   Interventions Encouraged to attend Cardiac Rehabilitation for  the exercise;Stress management education       Comments Janann AugustHelene is a single mother with three kids, including one with special needs.  She is doing the best she can by her kids, they come first.  She has had some increased stress with a friend being admitted suddenly to hospice in [place].  She is sleeping okay but listening to relaxation music to help her get back to sleep.  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          Vocational Rehabilitation: Provide vocational rehab assistance to qualifying candidates.   Vocational Rehab Evaluation & Intervention:     Vocational Rehab - 09/19/16 1530      Initial Vocational Rehab Evaluation & Intervention   Assessment shows need for Vocational Rehabilitation No      Education: Education Goals: Education classes will be provided on a weekly basis, covering required topics. Participant will state understanding/return demonstration of topics presented.  Learning Barriers/Preferences:     Learning  Barriers/Preferences - 09/19/16 1538      Learning Barriers/Preferences   Learning Barriers None   Learning Preferences None      Education Topics: General Nutrition Guidelines/Fats and Fiber: -Group instruction provided by verbal, written material, models and posters to present the general guidelines for heart healthy nutrition. Gives an explanation and review of dietary fats and fiber. Flowsheet Row Cardiac Rehab from 10/25/2016 in [facility] Cardiac and Pulmonary Rehab  Date  10/04/16  Educator  SB  Instruction Review Code  2- meets goals/outcomes      Controlling Sodium/Reading Food Labels: -Group verbal and written material supporting the discussion of sodium use in heart healthy nutrition. Review and explanation with models, verbal and written materials for utilization of the food label. Flowsheet Row Cardiac Rehab from 10/25/2016 in [facility] Cardiac and Pulmonary Rehab  Date  10/11/16   Educator  PI  Instruction Review Code  2- meets goals/outcomes      Exercise Physiology & Risk Factors: - Group verbal and written instruction with models to review the exercise physiology of the cardiovascular system and associated critical values. Details cardiovascular disease risk factors and the goals associated with each risk factor. Flowsheet Row Cardiac Rehab from 10/25/2016 in [facility] Cardiac and Pulmonary Rehab  Date  10/17/16  Educator  [facility]  Instruction Review Code  2- meets goals/outcomes      Aerobic Exercise & Resistance Training: - Gives group verbal and written discussion on the health impact of inactivity. On the components of aerobic and resistive training programs and the benefits of this training and how to safely progress through these programs. Flowsheet Row Cardiac Rehab from 10/25/2016 in [facility] Cardiac and Pulmonary Rehab  Date  10/18/16  Educator  [facility]  Instruction Review Code  2- meets goals/outcomes      Flexibility, Balance, General Exercise Guidelines: - Provides group verbal and written instruction on the benefits of flexibility and balance training programs. Provides general exercise guidelines with specific guidelines to those with heart or lung disease. Demonstration and skill practice provided. Flowsheet Row Cardiac Rehab from 10/25/2016 in [facility] Cardiac and Pulmonary Rehab  Date  10/20/16  Educator  [facility]  Instruction Review Code  2- meets goals/outcomes      Stress Management: - Provides group verbal and written instruction about the health risks of elevated stress, cause of high stress, and healthy ways to reduce stress.   Depression: - Provides group verbal and written instruction on the correlation between heart/lung disease and depressed mood, treatment options, and the stigmas associated with seeking treatment. Flowsheet Row Cardiac Rehab from 10/25/2016 in [facility] Cardiac and Pulmonary Rehab  Date  10/06/16  Educator  CE  Instruction Review Code  2-  meets goals/outcomes      Anatomy & Physiology of the Heart: - Group verbal and written instruction and models provide basic cardiac anatomy and physiology, with the coronary electrical and arterial systems. Review of: AMI, Angina, Valve disease, Heart Failure, Cardiac Arrhythmia, Pacemakers, and the ICD. Flowsheet Row Cardiac Rehab from 10/25/2016 in [facility] Cardiac and Pulmonary Rehab  Date  10/25/16  Educator  SB  Instruction Review Code  2- meets goals/outcomes      Cardiac Procedures: - Group verbal and written instruction and models to describe the testing methods done to diagnose heart disease. Reviews the outcomes of the test results. Describes the treatment choices: Medical Management, Angioplasty, or Coronary Bypass Surgery.   Cardiac Medications: - Group verbal and written instruction to review commonly prescribed medications for heart disease.  Reviews the medication, class of the drug, and side effects. Includes the steps to properly store meds and maintain the prescription regimen.   Go Sex-Intimacy & Heart Disease, Get SMART - Goal Setting: - Group verbal and written instruction through game format to discuss heart disease and the return to sexual intimacy. Provides group verbal and written material to discuss and apply goal setting through the application of the S.M.A.R.T. Method.   Other Matters of the Heart: - Provides group verbal, written materials and models to describe Heart Failure, Angina, Valve Disease, and Diabetes in the realm of heart disease. Includes description of the disease process and treatment options available to the cardiac patient.   Exercise & Equipment Safety: - Individual verbal instruction and demonstration of equipment use and safety with use of the equipment. Flowsheet Row Cardiac Rehab from 10/25/2016 in [facility] Cardiac and Pulmonary Rehab  Date  09/19/16  Educator  SB  Instruction Review Code  2- meets goals/outcomes      Infection  Prevention: - Provides verbal and written material to individual with discussion of infection control including proper hand washing and proper equipment cleaning during exercise session. Flowsheet Row Cardiac Rehab from 10/25/2016 in [facility] Cardiac and Pulmonary Rehab  Date  09/19/16  Educator  SB  Instruction Review Code  2- meets goals/outcomes      Falls Prevention: - Provides verbal and written material to individual with discussion of falls prevention and safety.   Diabetes: - Individual verbal and written instruction to review signs/symptoms of diabetes, desired ranges of glucose level fasting, after meals and with exercise. Advice that pre and post exercise glucose checks will be done for 3 sessions at entry of program.    Knowledge Questionnaire Score:     Knowledge Questionnaire Score - 09/19/16 1530      Knowledge Questionnaire Score   Pre Score 24/28      Core Components/Risk Factors/Patient Goals at Admission:     Personal Goals and Risk Factors at Admission - 09/19/16 1547      Core Components/Risk Factors/Patient Goals on Admission    Weight Management Obesity;Weight Loss;Yes   Intervention Weight Management: Develop a combined nutrition and exercise program designed to reach desired caloric intake, while maintaining appropriate intake of nutrient and fiber, sodium and fats, and appropriate energy expenditure required for the weight goal.;Weight Management: Provide education and appropriate resources to help participant work on and attain dietary goals.;Weight Management/Obesity: Establish reasonable short term and long term weight goals.;Obesity: Provide education and appropriate resources to help participant work on and attain dietary goals.   Admit Weight 237 lb 4.8 oz (107.6 kg)   Goal Weight: Short Term 234 lb (106.1 kg)   Goal Weight: Long Term 180 lb (81.6 kg)   Expected Outcomes Short Term: Continue to assess and modify interventions until short term weight  is achieved;Long Term: Adherence to nutrition and physical activity/exercise program aimed toward attainment of established weight goal;Weight Loss: Understanding of general recommendations for a balanced deficit meal plan, which promotes 1-2 lb weight loss per week and includes a negative energy balance of [id number] kcal/d   Sedentary Yes   Intervention Provide advice, education, support and counseling about physical activity/exercise needs.;Develop an individualized exercise prescription for aerobic and resistive training based on initial evaluation findings, risk stratification, comorbidities and participant's personal goals.   Expected Outcomes Achievement of increased cardiorespiratory fitness and enhanced flexibility, muscular endurance and strength shown through measurements of functional capacity and personal statement of participant.   Increase  Strength and Stamina Yes   Intervention Provide advice, education, support and counseling about physical activity/exercise needs.;Develop an individualized exercise prescription for aerobic and resistive training based on initial evaluation findings, risk stratification, comorbidities and participant's personal goals.   Expected Outcomes Achievement of increased cardiorespiratory fitness and enhanced flexibility, muscular endurance and strength shown through measurements of functional capacity and personal statement of participant.   Lipids Yes   Intervention Provide education and support for participant on nutrition & aerobic/resistive exercise along with prescribed medications to achieve LDL 70mg , HDL >40mg .   Expected Outcomes Short Term: Participant states understanding of desired cholesterol values and is compliant with medications prescribed. Participant is following exercise prescription and nutrition guidelines.;Long Term: Cholesterol controlled with medications as prescribed, with individualized exercise RX and with personalized nutrition plan.  Value goals: LDL < 70mg , HDL > 40 mg.      Core Components/Risk Factors/Patient Goals Review:      Goals and Risk Factor Review    Row Name 10/20/16 1057             Core Components/Risk Factors/Patient Goals Review   Personal Goals Review Weight Management/Obesity;Sedentary;Increase Strength and Stamina;Hypertension;Stress;Lipids       Review Amilyah has been doing well with rehab.  She is feeling stronger.  Her weight has been steady as have her blood pressures. She has not had any issues with her medications.  Her stress was up some today after a friend was put into hospice.       Expected Outcomes Laisa will continue to come to rehab for education, exercise, and risk factor modification.          Core Components/Risk Factors/Patient Goals at Discharge (Final Review):      Goals and Risk Factor Review - 10/20/16 1057      Core Components/Risk Factors/Patient Goals Review   Personal Goals Review Weight Management/Obesity;Sedentary;Increase Strength and Stamina;Hypertension;Stress;Lipids   Review Kassadi has been doing well with rehab.  She is feeling stronger.  Her weight has been steady as have her blood pressures. She has not had any issues with her medications.  Her stress was up some today after a friend was put into hospice.   Expected Outcomes Maddyson will continue to come to rehab for education, exercise, and risk factor modification.      ITP Comments:     ITP Comments    Row Name 09/19/16 1540 09/28/16 0700 10/25/16 1037 10/26/16 0601     ITP Comments Medical review completed. Intitial ITP created.  Documentation of Diagnosis can be found in [facility] Encounter 01/05/2016 30 day review completed for Medical Director physician review and signature. Continue ITP unless changes made by physician.New to program   Lollie will going to [facility] for a research study next week. 30 day review completed for review by Dr Bethann Punches.  Continue with ITP unless  changes noted by Dr Hyacinth Meeker.       Comments:

## 2016-10-27 DIAGNOSIS — Z9889 Other specified postprocedural states: Secondary | ICD-10-CM

## 2016-10-27 DIAGNOSIS — Z952 Presence of prosthetic heart valve: Secondary | ICD-10-CM

## 2016-10-27 DIAGNOSIS — Z951 Presence of aortocoronary bypass graft: Secondary | ICD-10-CM | POA: Diagnosis not present

## 2016-10-27 NOTE — Progress Notes (Signed)
Daily Session Note  Patient Details  Name: Brittany Newton MRN: [id number] Date of Birth: 1961-11-06 Referring Provider:   Flowsheet Row Cardiac Rehab from 09/19/2016 in [facility] Cardiac and Pulmonary Rehab  Referring Provider  Ward, Jeani Hawking MD      Encounter Date: 10/27/2016  Check In:     Session Check In - 10/27/16 0912      Check-In   Location [facility]-Cardiac & Pulmonary Rehab   Staff Present Alberteen Sam, MA, ACSM RCEP, Exercise Physiologist;Amanda Oletta Darter, BA, ACSM CEP, Exercise Physiologist;Other  Jena Gauss, RN   Supervising physician immediately available to respond to emergencies See telemetry face sheet for immediately available ER MD   Medication changes reported     No   Fall or balance concerns reported    No   Warm-up and Cool-down Performed on first and last piece of equipment   Resistance Training Performed Yes   VAD Patient? No     Pain Assessment   Currently in Pain? No/denies   Multiple Pain Sites No         Goals Met:  Proper associated with RPD/PD & O2 Sat Independence with exercise equipment Exercise tolerated well Strength training completed today  Goals Unmet:  Not Applicable  Comments: Pt able to follow exercise prescription today without complaint.  Will continue to monitor for progression.    Dr. Emily Filbert is Medical Director for [place] and [facility].

## 2016-11-03 DIAGNOSIS — Z951 Presence of aortocoronary bypass graft: Secondary | ICD-10-CM

## 2016-11-03 DIAGNOSIS — Z9889 Other specified postprocedural states: Secondary | ICD-10-CM

## 2016-11-03 DIAGNOSIS — Z952 Presence of prosthetic heart valve: Secondary | ICD-10-CM

## 2016-11-03 NOTE — Progress Notes (Signed)
Daily Session Note  Patient Details  Name: Brittany Newton MRN: [id number] Date of Birth: July 19, 1961 Referring Provider:   Flowsheet Row Cardiac Rehab from 09/19/2016 in [facility] Cardiac and Pulmonary Rehab  Referring Provider  Ward, Jeani Hawking MD      Encounter Date: 11/03/2016  Check In:     Session Check In - 11/03/16 0838      Check-In   Location [facility]-Cardiac & Pulmonary Rehab   Staff Present Alberteen Sam, MA, ACSM RCEP, Exercise Physiologist;Amanda Oletta Darter, BA, ACSM CEP, Exercise Physiologist;Other  Jena Gauss RN   Supervising physician immediately available to respond to emergencies See telemetry face sheet for immediately available ER MD   Medication changes reported     No   Fall or balance concerns reported    No   Warm-up and Cool-down Performed on first and last piece of equipment   Resistance Training Performed Yes   VAD Patient? No     Pain Assessment   Currently in Pain? No/denies   Multiple Pain Sites No         Goals Met:  Independence with exercise equipment Exercise tolerated well No report of cardiac concerns or symptoms Strength training completed today  Goals Unmet:  Not Applicable  Comments: Pt able to follow exercise prescription today without complaint.  Will continue to monitor for progression.  [name] enjoyed her trip to the [place].  They provided her with some tools like support gloves, knee braces, aids for using weights, etc.  She also got approved to increase her THR to 120.  She is super excited to see what she can do.   Dr. Emily Filbert is Medical Director for [place] and [facility].

## 2016-11-08 ENCOUNTER — Encounter: Payer: Medicaid Other | Admitting: Respiratory Therapy

## 2016-11-08 DIAGNOSIS — Z952 Presence of prosthetic heart valve: Secondary | ICD-10-CM

## 2016-11-08 DIAGNOSIS — Z951 Presence of aortocoronary bypass graft: Secondary | ICD-10-CM

## 2016-11-08 DIAGNOSIS — Z9889 Other specified postprocedural states: Secondary | ICD-10-CM

## 2016-11-08 NOTE — Progress Notes (Signed)
Daily Session Note  Patient Details  Name: Dianna Ewald MRN: [id number] Date of Birth: 07/07/1961 Referring Provider:   Flowsheet Row Cardiac Rehab from 09/19/2016 in [facility] Cardiac and Pulmonary Rehab  Referring Provider  Ward, Jeani Hawking MD      Encounter Date: 11/08/2016  Check In:     Session Check In - 11/08/16 0823      Check-In   Location [facility]-Cardiac & Pulmonary Rehab   Staff Present Alberteen Sam, MA, ACSM RCEP, Exercise Physiologist;Susanne Bice, RN, BSN, CCRP;Laureen Owens Shark, BS, RRT, Respiratory Therapist   Supervising physician immediately available to respond to emergencies See telemetry face sheet for immediately available ER MD   Medication changes reported     No   Fall or balance concerns reported    No   Warm-up and Cool-down Performed on first and last piece of equipment   Resistance Training Performed Yes   VAD Patient? No     Pain Assessment   Currently in Pain? No/denies   Multiple Pain Sites No         Goals Met:  Independence with exercise equipment Exercise tolerated well Personal goals reviewed No report of cardiac concerns or symptoms Strength training completed today  Goals Unmet:  Not Applicable  Comments: Pt able to follow exercise prescription today without complaint.  Will continue to monitor for progression.    Dr. Emily Filbert is Medical Director for [place] and [facility].

## 2016-11-09 ENCOUNTER — Ambulatory Visit: Payer: Medicaid Other | Attending: Cardiology

## 2016-11-09 DIAGNOSIS — M6281 Muscle weakness (generalized): Secondary | ICD-10-CM | POA: Diagnosis present

## 2016-11-09 DIAGNOSIS — G8929 Other chronic pain: Secondary | ICD-10-CM | POA: Insufficient documentation

## 2016-11-09 DIAGNOSIS — M25561 Pain in right knee: Secondary | ICD-10-CM | POA: Insufficient documentation

## 2016-11-09 DIAGNOSIS — M25562 Pain in left knee: Secondary | ICD-10-CM | POA: Diagnosis present

## 2016-11-10 DIAGNOSIS — Z951 Presence of aortocoronary bypass graft: Secondary | ICD-10-CM | POA: Diagnosis not present

## 2016-11-10 DIAGNOSIS — Z952 Presence of prosthetic heart valve: Secondary | ICD-10-CM

## 2016-11-10 DIAGNOSIS — Z9889 Other specified postprocedural states: Secondary | ICD-10-CM

## 2016-11-10 NOTE — Progress Notes (Signed)
Daily Session Note  Patient Details  Name: Brittany Newton MRN: [id number] Date of Birth: 09-07-1961 Referring Provider:   Flowsheet Row Cardiac Rehab from 09/19/2016 in [facility] Cardiac and Pulmonary Rehab  Referring Provider  Ward, Jeani Hawking MD      Encounter Date: 11/10/2016  Check In:     Session Check In - 11/10/16 0848      Check-In   Location [facility]-Cardiac & Pulmonary Rehab   Staff Present Alberteen Sam, MA, ACSM RCEP, Exercise Physiologist;Amanda Oletta Darter, BA, ACSM CEP, Exercise Physiologist;Patricia Surles RN BSN   Supervising physician immediately available to respond to emergencies See telemetry face sheet for immediately available ER MD   Medication changes reported     No   Fall or balance concerns reported    No   Warm-up and Cool-down Performed on first and last piece of equipment   Resistance Training Performed Yes   VAD Patient? No     Pain Assessment   Currently in Pain? No/denies         Goals Met:  Independence with exercise equipment Exercise tolerated well No report of cardiac concerns or symptoms Strength training completed today  Goals Unmet:  Not Applicable  Comments: Pt able to follow exercise prescription today without complaint.  Will continue to monitor for progression.    Dr. Emily Filbert is Medical Director for [place] and [facility].

## 2016-11-10 NOTE — Therapy (Signed)
[facility] MAIN [facility] SERVICES [address] [place], [place], [address] Phone: [phone]   Fax:  [phone]  Physical Therapy Evaluation  Patient Details  Name: Brittany Newton MRN: [id number] Date of Birth: 1961-08-01 No Data Recorded  Encounter Date: 11/09/2016      PT End of Session - 11/09/16 1759    Visit Number 1   Number of Visits 16   Date for PT Re-Evaluation 01/04/17   PT Start Time 0800   PT Stop Time 0900   PT Time Calculation (min) 60 min   Activity Tolerance Patient tolerated treatment well      Past Medical History:  Diagnosis Date  . Atrial fibrillation (HCC)   . Cardiomegaly   . Loeys-Dietz syndrome    type 1  . Shingles   . Thyroid disease    hyperthyroidism    Past Surgical History:  Procedure Laterality Date  . ABDOMINAL HYSTERECTOMY  2009   complete   . AORTIC VALVE REPLACEMENT     b[place]ine  . CESAREAN SECTION  1994   emergency  . CORONARY ARTERY BYPASS GRAFT    . HERNIA REPAIR  1965   double hernia repair  . MAZE    . MINIMALLY INVASIVE TRICUSPID VALVE REPAIR    . MITRAL VALVE REPAIR    . PACEMAKER INSERTION      There were no vitals filed for this visit.       Subjective Assessment - 11/09/16 1256    Subjective Patient with diagonsis of Loeys-Dietz reports increased B knee pain and difficulty with transfering from sit to standing, ascending/descending the steps, bending to lift objects from the ground, walking long distances, and standing for large periods of time. Patient reports increased pain with bending her knee in weight bearing positions. States the worse pain in the past week has been a 6/10 while and the best pain in the past week has been 0/10. Patient reports  she's had a recent decrease in activity level from several procedures she had. States she is currently attending cardiac rehab post CABG and valve replacement. Reports she ambulates with two medial/lateral knee braces for long  distances.    Pertinent History Hx of CABG, Tricuspid valve replacement, mitral valve replacement, aortic valve replacement. Partially torn ACL, meniscus on L knee (>10years ago)   Limitations Lifting;Standing;Walking;House hold activities   How long can you sit comfortably? As long as she wants   How long can you stand comfortably?   Currently in Pain? No/denies   Pain Score 0-No pain   Pain Location Knee  Bilateral Knees   Pain Orientation Right;Left   Pain Descriptors / Indicators Aching   Pain Type Chronic pain   Pain Onset More than a month ago   Pain Frequency Intermittent   Multiple Pain Sites No            [facility] PT Assessment - 11/09/16 0833      Assessment   Medical Diagnosis loeys-dietz syndrome   Onset Date/Surgical Date 11/22/11   Hand Dominance Right   Next MD Visit unknown   Prior Therapy yes     Precautions   Precautions Fall     Restrictions   Weight Bearing Restrictions No     Balance Screen   Has the patient fallen in the past 6 months No   Has the patient had a decrease in activity level because of a fear of falling?  Yes   Is the patient reluctant to  leave their home because of a fear of falling?  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   Leisure playing with kids     Cognition   Overall Cognitive Status Within Functional Limits for tasks assessed     ROM / Strength   AROM / PROM / Strength Strength     AROM   Overall AROM Comments Hypermobility along knee joints bilaterally   AROM  Assessment Site Knee   Right/Left Knee Right;Left   Right Knee Extension -5   Right Knee Flexion 150   Left Knee Extension 0   Left Knee Flexion 150     Strength   Strength Assessment Site Hip;Knee;Ankle   Right/Left Hip Right;Left   Right Hip Flexion 4-/5   Right Hip ABduction 4-/5   Right Hip ADduction 4-/5   Left Hip Flexion 3+/5   Left Hip ABduction 4-/5   Left Hip ADduction 4-/5   Right/Left Knee Right;Left   Right Knee Flexion 4-/5   Right Knee Extension 3+/5   Left Knee Flexion 4-/5   Left Knee Extension 4-/5   Right Ankle Dorsiflexion 4/5   Right Ankle Plantar Flexion 4-/5   Left Ankle Dorsiflexion 4/5   Left Ankle Plantar Flexion 4-/5      Observation: Special Tests: Bilateral Le: Lachmann's: Neg (positive for laxity but not tear), MCL: Neg, LCL: Neg Step down test: + (increased hip IR/ADD) bilaterally  Gait: Shortened step length bilaterally, antalgic gait decreased time on L versus R; gait speed: WNL Stairs: Step to ascension/desencion with B UE support  TREATMENT: Hip abduction in sidelying -- x 10 Bilaterally Sit to stands -- x 10         PT Education - 11/09/16 1759    Education provided Yes   Education Details HEP: sidelying hip abduction, sit to stands   Person(s) Educated Patient   Methods Explanation;Demonstration;Tactile cues   Comprehension Verbalized understanding;Returned demonstration             PT Long Term Goals - 11/10/16 0909      PT LONG TERM GOAL #1   Title Patient will demonstrate decreased knee pain to 0/10 to demonstrate improve pain when walking.   Baseline 6/10 pain with walking   Time 8   Period Weeks   Status New     PT LONG TERM GOAL #2   Title Patient will improve LEFS score to 48 points to demonstrate improvement with walking    Baseline LEFS: 36   Time 8   Period Weeks   Status New     PT LONG TERM GOAL #3   Title Patient will demonstrate independence with HEP to continue benefits from therapy from  discharge.    Baseline Dependent with exercise performance/technique   Time 8   Period Weeks   Status New               Plan - 11/10/16 0859    Clinical Impression Statement Pt is a 55 yo right  hand dominant female presenting with bilateral knee dysfunction secondary to Loeys-Dietz Syndrome. Patient demonstrates symptoms congruent with patellar femoral pain syndrome has indicating by location of pain and a positive step down test. Patient also demonstrates decreased strength and coordination throughout the knee as indicated by decreased MMT scores and LEFS. Patient will benefit from further skilled therapy to return to prior level of function.    Rehab Potential Fair   Clinical Impairments Affecting Rehab Potential (+) highly motivated, family support (-) Loeys-Dietz syndrome   PT Frequency 2x / week   PT Duration 8 weeks   PT Treatment/Interventions Neuromuscular re-education;Patient/family education;Manual techniques;Iontophoresis 4mg /ml Dexamethasone;Electrical Stimulation;Cryotherapy;Stair training;Therapeutic activities;Therapeutic exercise;Balance training   PT Next Visit Plan Progress strengthening and knee isometrics    PT Home Exercise Plan Hip abduction in sidelying; sit to stands   Consulted and Agree with Plan of Care Patient      Patient will benefit from skilled therapeutic intervention in order to improve the following deficits and impairments:  Abnormal gait, Pain, Increased muscle spasms, Decreased coordination, Decreased endurance, Decreased activity tolerance, Difficulty walking, Decreased strength  Visit Diagnosis: Muscle weakness (generalized)  Chronic pain of right knee  Chronic pain of left knee     Problem List Patient Active Problem List   Diagnosis Date Noted  . Biventricular cardiac pacemaker in situ 04/12/2016  . Chronic anticoagulation 02/08/2016  . S/P aortic valve replacement with bioprosthetic valve 01/07/2016  . S/P CABG x 1 01/07/2016   . S/P mitral valve repair 01/07/2016  . S/P tricuspid valve repair 01/07/2016  . Loeys-Dietz syndrome type 1 12/10/2015  . History of vitamin D deficiency 11/06/2015  . B12 deficiency 07/31/2014  . Atrial fibrillation, chronic (HCC) 04/16/2014  . Hyperthyroidism 03/13/2014    Myrene GalasWesley Bettymae Yott, PT DPT 11/10/2016, 9:30 AM  [place] [facility] [address] [place], [place], [address] Phone: [phone]   Fax:  [phone]  Name: Vonda AntiguaHelene Yeagle MRN: [id number] Date of Birth: Mar 06, 1961

## 2016-11-15 ENCOUNTER — Encounter: Payer: Medicaid Other | Admitting: Respiratory Therapy

## 2016-11-15 DIAGNOSIS — Z952 Presence of prosthetic heart valve: Secondary | ICD-10-CM

## 2016-11-15 DIAGNOSIS — Z9889 Other specified postprocedural states: Secondary | ICD-10-CM

## 2016-11-15 DIAGNOSIS — Z951 Presence of aortocoronary bypass graft: Secondary | ICD-10-CM

## 2016-11-15 NOTE — Progress Notes (Signed)
Daily Session Note  Patient Details  Name: Brittany Newton MRN: [id number] Date of Birth: 03/01/1961 Referring Provider:   Flowsheet Row Cardiac Rehab from 09/19/2016 in [facility] Cardiac and Pulmonary Rehab  Referring Provider  Ward, Jeani Hawking MD      Encounter Date: 11/15/2016  Check In:     Session Check In - 11/15/16 0950      Check-In   Location [facility]-Cardiac & Pulmonary Rehab   Staff Present Alberteen Sam, MA, ACSM RCEP, Exercise Physiologist;Susanne Bice, RN, BSN, CCRP;Laureen Owens Shark, BS, RRT, Respiratory Therapist   Supervising physician immediately available to respond to emergencies See telemetry face sheet for immediately available ER MD   Medication changes reported     No   Fall or balance concerns reported    No   Warm-up and Cool-down Performed on first and last piece of equipment   Resistance Training Performed Yes   VAD Patient? No     Pain Assessment   Currently in Pain? No/denies         Goals Met:  Proper associated with RPD/PD & O2 Sat Independence with exercise equipment Exercise tolerated well No report of cardiac concerns or symptoms Strength training completed today  Goals Unmet:  Not Applicable  Comments: Pt able to follow exercise prescription today without complaint.  Will continue to monitor for progression.   Dr. Emily Filbert is Medical Director for [place] and [facility].

## 2016-11-17 ENCOUNTER — Encounter: Payer: Medicaid Other | Admitting: *Deleted

## 2016-11-17 DIAGNOSIS — Z951 Presence of aortocoronary bypass graft: Secondary | ICD-10-CM | POA: Diagnosis not present

## 2016-11-17 DIAGNOSIS — Z952 Presence of prosthetic heart valve: Secondary | ICD-10-CM

## 2016-11-17 DIAGNOSIS — Z9889 Other specified postprocedural states: Secondary | ICD-10-CM

## 2016-11-17 NOTE — Progress Notes (Signed)
Daily Session Note  Patient Details  Name: Brittany Newton MRN: [id number] Date of Birth: 10/16/1961 Referring Provider:   Flowsheet Row Cardiac Rehab from 09/19/2016 in [facility] Cardiac and Pulmonary Rehab  Referring Provider  Ward, Jeani Hawking MD      Encounter Date: 11/17/2016  Check In:     Session Check In - 11/17/16 1007      Check-In   Location [facility]-Cardiac & Pulmonary Rehab   Staff Present Heath Lark, RN, BSN, [place], [place], ACSM RCEP, Exercise Physiologist;Kristl Morioka RN BSN   Supervising physician immediately available to respond to emergencies See telemetry face sheet for immediately available ER MD   Medication changes reported     No   Fall or balance concerns reported    No   Warm-up and Cool-down Performed on first and last piece of equipment   Resistance Training Performed Yes   VAD Patient? No     Pain Assessment   Currently in Pain? No/denies         Goals Met:  Proper associated with RPD/PD & O2 Sat Independence with exercise equipment Exercise tolerated well No report of cardiac concerns or symptoms Strength training completed today  Goals Unmet:  Not Applicable  Comments: Pt able to follow exercise prescription today without complaint.  Will continue to monitor for progression.    Dr. Emily Filbert is Medical Director for [place] and [facility].

## 2016-11-22 ENCOUNTER — Encounter: Payer: Self-pay | Admitting: *Deleted

## 2016-11-22 ENCOUNTER — Encounter: Payer: Medicaid Other | Attending: Internal Medicine | Admitting: Respiratory Therapy

## 2016-11-22 DIAGNOSIS — I5022 Chronic systolic (congestive) heart failure: Secondary | ICD-10-CM | POA: Diagnosis present

## 2016-11-22 DIAGNOSIS — Z951 Presence of aortocoronary bypass graft: Secondary | ICD-10-CM

## 2016-11-22 DIAGNOSIS — Z9889 Other specified postprocedural states: Secondary | ICD-10-CM

## 2016-11-22 DIAGNOSIS — Z952 Presence of prosthetic heart valve: Secondary | ICD-10-CM

## 2016-11-22 DIAGNOSIS — Z954 Presence of other heart-valve replacement: Secondary | ICD-10-CM | POA: Insufficient documentation

## 2016-11-22 NOTE — Progress Notes (Signed)
Cardiac Individual Treatment Plan  Patient Details  Name: Brittany Newton MRN: [id number] Date of Birth: [birth year] Referring Provider:   Flowsheet Row Cardiac Rehab from 09/19/2016 in [facility] Cardiac and Pulmonary Rehab  Referring Provider  Ward, Jeani Hawking MD      Initial Encounter Date:  Flowsheet Row Cardiac Rehab from 09/19/2016 in [facility] Cardiac and Pulmonary Rehab  Date  09/19/16  Referring Provider  Ward, Jeani Hawking MD      Visit Diagnosis: S/P aortic valve replacement  S/P CABG x 1  S/P tricuspid valve repair  S/P mitral valve repair  Patient's Home Medications on Admission:  Current Outpatient Prescriptions:  .  acetaminophen (TYLENOL) 500 MG tablet, Take 500 mg by mouth every 8 (eight) hours as needed for pain., Disp: , Rfl:  .  aspirin EC 81 MG tablet, Take 81 mg by mouth daily., Disp: , Rfl:  .  atorvastatin (LIPITOR) 40 MG tablet, Take 40 mg by mouth daily., Disp: , Rfl:  .  cholecalciferol (VITAMIN D) 1000 units tablet, Take 2,000 Units by mouth daily., Disp: , Rfl:  .  diclofenac sodium (VOLTAREN) 1 % GEL, Apply 2 g topically 2 (two) times daily as needed., Disp: , Rfl:  .  digoxin (LANOXIN) 0.25 MG tablet, Take 0.25 mg by mouth daily., Disp: , Rfl:  .  fexofenadine (ALLEGRA) 180 MG tablet, Take 180 mg by mouth every evening., Disp: , Rfl:  .  furosemide (LASIX) 40 MG tablet, Take 40 mg by mouth., Disp: , Rfl:  .  gabapentin (NEURONTIN) 400 MG capsule, Take 400 mg by mouth 3 (three) times daily., Disp: , Rfl:  .  HYDROmorphone (DILAUDID) 2 MG tablet, Take 2 mg by mouth 2 (two) times daily., Disp: , Rfl:  .  levalbuterol (XOPENEX HFA) 45 MCG/ACT inhaler, Inhale 2 puffs into the lungs every 4 (four) hours as needed for wheezing., Disp: , Rfl:  .  metoprolol (LOPRESSOR) 50 MG tablet, Take 50 mg by mouth 2 (two) times daily., Disp: , Rfl:  .  potassium chloride SA (K-DUR,KLOR-CON) 20 MEQ tablet, Take 20 mEq by mouth daily., Disp: , Rfl:  .  predniSONE (DELTASONE) 5 MG tablet, Take 5  mg by mouth daily with breakfast., Disp: , Rfl:  .  warfarin (COUMADIN) 5 MG tablet, Take 5 mg by mouth daily at 6 PM., Disp: , Rfl:   Past Medical History: Past Medical History:  Diagnosis Date  . Atrial fibrillation ([place])   . Cardiomegaly   . Loeys-Dietz syndrome    type 1  . Shingles   . Thyroid disease    hyperthyroidism    Tobacco Use: History  Smoking Status  . Never Smoker  Smokeless Tobacco  . Never Used    Labs: Recent Review Flowsheet Data    Labs for ITP Cardiac and Pulmonary Rehab Latest Ref Rng & Units 07/23/2011 07/28/2011 06/14/2013   Cholestrol 0 - 200 mg/dL - - 207(H)   LDLCALC 0 - 100 mg/dL - - 112(H)   HDL 40 - 60 mg/dL - - 78(H)   Trlycerides 0 - 200 mg/dL - - 87   PHART 7.350 - 7.400 - 7.468(H) -   PCO2ART 35.0 - 45.0 mmHg - 32.3(L) -   HCO3 20.0 - 24.0 mEq/L - 23.1 -   TCO2 0 - 100 mmol/L 19 20.3 -   O2SAT % - 96.1 -       Exercise Target Goals:    Exercise Program Goal: Individual exercise prescription set with THRR, safety & activity  barriers. Participant demonstrates ability to understand and report RPE using BORG scale, to self-measure pulse accurately, and to acknowledge the importance of the exercise prescription.  Exercise Prescription Goal: Starting with aerobic activity 30 plus minutes a day, 3 days per week for initial exercise prescription. Provide home exercise prescription and guidelines that participant acknowledges understanding prior to discharge.  Activity Barriers & Risk Stratification:     Activity Barriers & Cardiac Risk Stratification - 09/19/16 1536      Activity Barriers & Cardiac Risk Stratification   Activity Barriers Joint Problems;Deconditioning;Muscular Weakness  Has Loeys-Dietz syndrome which may inhibit some exercise levels, has bilateral knee pain   Cardiac Risk Stratification High      6 Minute Walk:     6 Minute Walk    Row Name 09/19/16 1604         6 Minute Walk   Phase Initial     Distance 1185  feet     Walk Time 6 minutes     # of Rest Breaks 0     MPH 2.24     METS 3.47     RPE 11     VO2 Peak 12.13     Symptoms Yes (comment)     Comments knees achy     Resting HR 67 bpm     Resting BP 136/72     Max Ex. HR 96 bpm     Max Ex. BP 138/70     2 Minute Post BP 132/66        Initial Exercise Prescription:     Initial Exercise Prescription - 09/19/16 1600      Date of Initial Exercise RX and Referring Provider   Date 09/19/16   Referring Provider Ward, Cary MD     Treadmill   MPH 1.8   Grade 0.5   Minutes 15   METs 2.5     NuStep   Level 2   Watts --  80-100 spm   Minutes 15   METs 2     Biostep-RELP   Level 2   Watts --  40-50 spm   Minutes 15   METs 2     Prescription Details   Frequency (times per week) 2   Duration Progress to 45 minutes of aerobic exercise without signs/symptoms of physical distress     Intensity   THRR 40-80% of Max Heartrate <100 bpm  Based on [employer] Exercise Guidelines   Ratings of Perceived Exertion 11-15   Perceived Dyspnea 0-4     Progression   Progression Continue to progress workloads to maintain intensity without signs/symptoms of physical distress.     Resistance Training   Training Prescription Yes   Weight 3 lbs   Reps 10-12      Perform Capillary Blood Glucose checks as needed.  Exercise Prescription Changes:     Exercise Prescription Changes    Row Name 09/19/16 1500 10/04/16 1000 10/04/16 1600 10/19/16 1500 11/01/16 1600     Exercise Review   Progression -  Walk Test Results  - Yes Yes Yes     Response to Exercise   Blood Pressure (Admit) 136/72  - 126/64 98/50 126/84   Blood Pressure (Exercise) 138/70  - 124/64 126/72 124/70   Blood Pressure (Exit) 132/66  - 100/62 126/62 100/68   Heart Rate (Admit) 67 bpm  - 67 bpm 97 bpm 61 bpm   Heart Rate (Exercise) 96 bpm  - 101 bpm 101 bpm 93 bpm  Heart Rate (Exit) 69 bpm  - 84 bpm 85 bpm 87 bpm   Oxygen Saturation (Admit) 99 %  -  -  -   -   Oxygen Saturation (Exercise) 100 %  -  -  -  -   Rating of Perceived Exertion (Exercise) 11  - '11 15 14   ' Symptoms knees achy  - knee slips occasionally none none   Comments  - Home Exercise Guidelines given 10/04/16 Home Exercise Guidelines given 10/04/16 Home Exercise Guidelines given 10/04/16 Home Exercise Guidelines given 10/04/16   Duration  -  - Progress to 45 minutes of aerobic exercise without signs/symptoms of physical distress Progress to 45 minutes of aerobic exercise without signs/symptoms of physical distress Progress to 45 minutes of aerobic exercise without signs/symptoms of physical distress   Intensity  -  - THRR unchanged THRR unchanged THRR unchanged     Progression   Progression  -  - Continue to progress workloads to maintain intensity without signs/symptoms of physical distress. Continue to progress workloads to maintain intensity without signs/symptoms of physical distress. Continue to progress workloads to maintain intensity without signs/symptoms of physical distress.   Average METs  -  - 2.14 3 3.03     Resistance Training   Training Prescription  -  - Yes Yes Yes   Weight  -  - 3 lbs 3 lbs 3 lbs   Reps  -  - 10-12 10-12 10-12     Interval Training   Interval Training  -  - No No No     Treadmill   MPH  -  - 1.8 2.2 2.2   Grade  -  - 0.5 1.5 2   Minutes  -  - '15 15 15   ' METs  -  - 2.63 2.99 3.29     NuStep   Level  -  - '2 2 4   ' Minutes  -  - '15 15 15   ' METs  -  - 1.8 2 2.8     Biostep-RELP   Level  -  - '2 2 15   ' Minutes  -  - '15 15 15   ' METs  -  - '2 4 3     ' Home Exercise Plan   Plans to continue exercise at  - Home  walking at home and [facility]  walking at home and [facility]  walking at home and [facility]  walking at home and [facility]   Frequency  - Add 3 additional days to program exercise sessions. Add 3 additional days to program exercise sessions. Add 3 additional days to program exercise sessions. Add 3 additional days to program  exercise sessions.   [place] Name 11/15/16 1500             Exercise Review   Progression Yes         Response to Exercise   Blood Pressure (Admit) 126/54       Blood Pressure (Exercise) 136/70       Blood Pressure (Exit) 126/72       Heart Rate (Admit) 89 bpm       Heart Rate (Exercise) 103 bpm       Heart Rate (Exit) 77 bpm       Rating of Perceived Exertion (Exercise) 14       Symptoms none       Comments Home Exercise Guidelines given 10/04/16       Duration Progress to 45 minutes  of aerobic exercise without signs/symptoms of physical distress       Intensity THRR unchanged         Progression   Progression Continue to progress workloads to maintain intensity without signs/symptoms of physical distress.       Average METs 3.2         Resistance Training   Training Prescription Yes       Weight 3 lbs       Reps 10-12         Interval Training   Interval Training No         Treadmill   MPH 2.3       Grade 2       Minutes 15       METs 3.4         NuStep   Level 5       Minutes 15       METs 3.1         Biostep-RELP   Level 15       Minutes 15       METs 3         Home Exercise Plan   Plans to continue exercise at Home  walking at home and [facility]       Frequency Add 3 additional days to program exercise sessions.          Exercise Comments:     Exercise Comments    Row Name 09/27/16 0825 10/04/16 1052 10/04/16 1601 10/07/16 0900 10/19/16 1523   Exercise Comments First full day of exercise!  Patient was oriented to gym and equipment including functions, settings, policies, and procedures.  Patient's individual exercise prescription and treatment plan were reviewed.  All starting workloads were established based on the results of the 6 minute walk test done at initial orientation visit.  The plan for exercise progression was also introduced and progression will be customized based on patient's performance and goals. Reviewed home exercise with pt today.  Pt  plans to walk at home and at [facility] for exercise.  Reviewed THR, pulse, RPE, sign and symptoms, and when to call 911 or MD.  Also discussed weather considerations and indoor options.  Pt voiced understanding. Ahri is off to a good start with rehab.  She is posting on boards about her exercise in her online support groups.  She is so excited!  We will continue to monitor her progression. I spoke with Lillette Boxer about getting good supportive shoes to help decrease pain from connective tissue disorder.  She is going to have someone analyze her gait and help her find appropriate shoes. Keishawna is doing great in rehab.  She is starting to make some progress and stay under her THR.  She has looked into new shoes and is now just waiting approval for them to be purcharsed through the [facility].  We will continue to monitor her progression.   [place] Name 10/20/16 1104 10/27/16 0913 11/01/16 1608 11/03/16 1042 11/15/16 1547   Exercise Comments Reviewed METs and progression today. Evanthia got her shoes yesterday that were ordered through the [facility].  She was able to increase workload on TM, reports lessened knee pain and better balance with the support form the shoes.  Jakaila is doing well in rehab.  Today, she is in [place] at [facility] for research.  She continues to make improvements.  We will continue to monitor her progression. Jael enjoyed her trip to the [place].  They  provided her with some tools like support gloves, knee braces, aids for using weights, etc.  She also got approved to increase her THR to 120.  She is super excited to see what she can do. Valta is doing well in rehab.  He knee braces seem to be helping with her walking.  She has been pushing more since her heart rate increase.  She is now up to 3.2  METs.  We will continue to monitor her progression.      Discharge Exercise Prescription (Final Exercise Prescription Changes):     Exercise Prescription Changes - 11/15/16 1500       Exercise Review   Progression Yes     Response to Exercise   Blood Pressure (Admit) 126/54   Blood Pressure (Exercise) 136/70   Blood Pressure (Exit) 126/72   Heart Rate (Admit) 89 bpm   Heart Rate (Exercise) 103 bpm   Heart Rate (Exit) 77 bpm   Rating of Perceived Exertion (Exercise) 14   Symptoms none   Comments Home Exercise Guidelines given 10/04/16   Duration Progress to 45 minutes of aerobic exercise without signs/symptoms of physical distress   Intensity THRR unchanged     Progression   Progression Continue to progress workloads to maintain intensity without signs/symptoms of physical distress.   Average METs 3.2     Resistance Training   Training Prescription Yes   Weight 3 lbs   Reps 10-12     Interval Training   Interval Training No     Treadmill   MPH 2.3   Grade 2   Minutes 15   METs 3.4     NuStep   Level 5   Minutes 15   METs 3.1     Biostep-RELP   Level 15   Minutes 15   METs 3     Home Exercise Plan   Plans to continue exercise at Home  walking at home and [facility]   Frequency Add 3 additional days to program exercise sessions.      Nutrition:  Target Goals: Understanding of nutrition guidelines, daily intake of sodium <1519m, cholesterol <2044m calories 30% from fat and 7% or less from saturated fats, daily to have 5 or more servings of fruits and vegetables.  Biometrics:    Nutrition Therapy Plan and Nutrition Goals:     Nutrition Therapy & Goals - 09/19/16 1532      Intervention Plan   Intervention Prescribe, educate and counsel regarding individualized specific dietary modifications aiming towards targeted core components such as weight, hypertension, lipid management, diabetes, heart failure and other comorbidities.   Expected Outcomes Short Term Goal: Understand basic principles of dietary content, such as calories, fat, sodium, cholesterol and nutrients.;Short Term Goal: A plan has been developed with personal nutrition goals  set during dietitian appointment.;Long Term Goal: Adherence to prescribed nutrition plan.      Nutrition Discharge: Rate Your Plate Scores:     Nutrition Assessments - 09/19/16 1532      Rate Your Plate Scores   Pre Score 54   Pre Score % 62.1 %      Nutrition Goals Re-Evaluation:     Nutrition Goals Re-Evaluation    Row Name 10/20/16 1100 11/08/16 1043           Weight   Current Weight 233 lb 14.4 oz (106.1 kg)  -        Intervention Plan   Intervention Nutrition handout(s) given to patient.  -  Comments Casara declined an appointment with dietician.  She says the classes helped and she is making good decisions. Sujey continues to make good nutrition decisions.  She stays focused on her diet         Psychosocial: Target Goals: Acknowledge presence or absence of depression, maximize coping skills, provide positive support system. Participant is able to verbalize types and ability to use techniques and skills needed for reducing stress and depression.  Initial Review & Psychosocial Screening:     Initial Psych Review & Screening - 09/19/16 1532      Initial Review   Current issues with Current Stress Concerns   Source of Stress Concerns Chronic Illness;Financial  Not working presently, plans to apply for disability   Comments Single mother with 3 children ages 6,10 and 73 at home.  Mother lives with her too. One child with austism.     Family Dynamics   Good Support System? Yes  Mother ,daughter that live in [place]     Barriers   Psychosocial barriers to participate in program There are no identifiable barriers or psychosocial needs.;The patient should benefit from training in stress management and relaxation.     Screening Interventions   Interventions Encouraged to exercise      Quality of Life Scores:     Quality of Life - 09/19/16 1530      Quality of Life Scores   Health/Function Pre 27 %   Socioeconomic Pre 22.57 %   Psych/Spiritual Pre  26.57 %   Family Pre 30 %   GLOBAL Pre 26.27 %      PHQ-9: Recent Review Flowsheet Data    Depression screen PHQ 2/9 09/19/2016   Decreased Interest 1   Down, Depressed, Hopeless 1   PHQ - 2 Score 2   Altered sleeping 3   Tired, decreased energy 3    Change in appetite 0    Feeling bad or failure about yourself  0   Trouble concentrating 0   Moving slowly or fidgety/restless 0   Suicidal thoughts 0   PHQ-9 Score 8   Difficult doing work/chores Not difficult at all      Psychosocial Evaluation and Intervention:     Psychosocial Evaluation - 11/15/16 0929      Psychosocial Evaluation & Interventions   Comments Counselor met with Ms. Tarry today for initial psychosocial evaluation.  She is a 56 year old who has been diagnosed with A-Fib and an earlier diagnosis of Loeys-Dietz Syndrome.  Ms. Lemmie Evens has an 33 year old mother who lives with her and is a huge help with the (3) special needs children that she has ages 56-11.  Ms. 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  Ms. Lemmie Evens has multiple health issues in addition to a chronic sleep problem. She has had a sleep study with no indication of sleep apnea.  Ms. Lemmie Evens states her sleep patterns are intermittent with approx. 2 hours awake in the middle of the night typically.  Her appetite is good and she denies a history of depression.  She does admit to a history of anxiety and is prescribed a low dose of Xanax to help with this.  Her stressors are obviously her children and her health issues, but she reports being typically positive and handling things well most of the time.   She has goals to breathe easier and increase  her stamina while in this program.  Staff will continue to follow with Ms. Acocella throughout the course of this program.        Psychosocial Re-Evaluation:     Psychosocial Re-Evaluation    Row Name 10/20/16 1101 11/08/16 1044            Psychosocial Re-Evaluation   Interventions Encouraged to attend Cardiac Rehabilitation for the exercise;Stress management education Encouraged to attend Cardiac Rehabilitation for the exercise;Stress management education      Comments Imelda is a single mother with three kids, including one with special needs.  She is doing the best she can by her kids, they come first.  She has had some increased stress with a friend being admitted suddenly to hospice in [place].  She is sleeping okay but listening to relaxation music to help her get back to sleep.  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 Marcea's biggest stressor now is getting through the holidays with the kids at home, but she feels that this is a good stress.         Vocational Rehabilitation: Provide vocational rehab assistance to qualifying candidates.   Vocational Rehab Evaluation & Intervention:     Vocational Rehab - 09/19/16 1530      Initial Vocational Rehab Evaluation & Intervention   Assessment shows need for Vocational Rehabilitation No      Education: Education Goals: Education classes will be provided on a weekly basis, covering required topics. Participant will state understanding/return demonstration of topics presented.  Learning Barriers/Preferences:     Learning Barriers/Preferences - 09/19/16 1538      Learning Barriers/Preferences   Learning Barriers None   Learning Preferences None      Education Topics: General Nutrition Guidelines/Fats and Fiber: -Group instruction provided by verbal, written material, models and posters to present the general guidelines for heart healthy nutrition. Gives an explanation and review of dietary fats and fiber. Flowsheet Row Cardiac Rehab from 11/10/2016 in [facility] Cardiac and Pulmonary Rehab  Date  10/04/16  Educator  SB  Instruction Review Code  2- meets goals/outcomes      Controlling Sodium/Reading Food  Labels: -Group verbal and written material supporting the discussion of sodium use in heart healthy nutrition. Review and explanation with models, verbal and written materials for utilization of the food label. Flowsheet Row Cardiac Rehab from 11/10/2016 in [facility] Cardiac and Pulmonary Rehab  Date  10/11/16  Educator  PI  Instruction Review Code  2- meets goals/outcomes      Exercise Physiology & Risk Factors: - Group verbal and written instruction with models to review the exercise physiology of the cardiovascular system and associated critical values. Details cardiovascular disease risk factors and the goals associated with each risk factor. Flowsheet Row Cardiac Rehab from 11/10/2016 in [facility] Cardiac and Pulmonary Rehab  Date  10/17/16  Educator  [facility]  Instruction Review Code  2- meets goals/outcomes      Aerobic Exercise & Resistance Training: - Gives group verbal and written discussion on the health impact of inactivity. On the components of aerobic and resistive training programs and the benefits of this training and how to safely progress through these programs. Flowsheet Row Cardiac Rehab from 11/10/2016 in [facility] Cardiac and Pulmonary Rehab  Date  10/18/16  Educator  [facility]  Instruction Review Code  2- meets goals/outcomes      Flexibility, Balance, General Exercise Guidelines: - Provides group verbal and written instruction on  the benefits of flexibility and balance training programs. Provides general exercise guidelines with specific guidelines to those with heart or lung disease. Demonstration and skill practice provided. Flowsheet Row Cardiac Rehab from 11/10/2016 in [facility] Cardiac and Pulmonary Rehab  Date  10/20/16  Educator  [facility]  Instruction Review Code  2- meets goals/outcomes      Stress Management: - Provides group verbal and written instruction about the health risks of elevated stress, cause of high stress, and healthy ways to reduce stress. Flowsheet Row Cardiac Rehab  from 11/10/2016 in [facility] Cardiac and Pulmonary Rehab  Date  11/03/16  Educator  TS  Instruction Review Code  2- meets goals/outcomes      Depression: - Provides group verbal and written instruction on the correlation between heart/lung disease and depressed mood, treatment options, and the stigmas associated with seeking treatment. Flowsheet Row Cardiac Rehab from 11/10/2016 in [facility] Cardiac and Pulmonary Rehab  Date  10/06/16  Educator  CE  Instruction Review Code  2- meets goals/outcomes      Anatomy & Physiology of the Heart: - Group verbal and written instruction and models provide basic cardiac anatomy and physiology, with the coronary electrical and arterial systems. Review of: AMI, Angina, Valve disease, Heart Failure, Cardiac Arrhythmia, Pacemakers, and the ICD. Flowsheet Row Cardiac Rehab from 11/10/2016 in [facility] Cardiac and Pulmonary Rehab  Date  10/25/16  Educator  SB  Instruction Review Code  2- meets goals/outcomes      Cardiac Procedures: - Group verbal and written instruction and models to describe the testing methods done to diagnose heart disease. Reviews the outcomes of the test results. Describes the treatment choices: Medical Management, Angioplasty, or Coronary Bypass Surgery.   Cardiac Medications: - Group verbal and written instruction to review commonly prescribed medications for heart disease. Reviews the medication, class of the drug, and side effects. Includes the steps to properly store meds and maintain the prescription regimen. Flowsheet Row Cardiac Rehab from 11/10/2016 in [facility] Cardiac and Pulmonary Rehab  Date  11/10/16 Marisue Humble 2]  Educator  TS  Instruction Review Code  2- meets goals/outcomes      Go Sex-Intimacy & Heart Disease, Get SMART - Goal Setting: - Group verbal and written instruction through game format to discuss heart disease and the return to sexual intimacy. Provides group verbal and written material to discuss and apply goal setting  through the application of the S.M.A.R.T. Method.   Other Matters of the Heart: - Provides group verbal, written materials and models to describe Heart Failure, Angina, Valve Disease, and Diabetes in the realm of heart disease. Includes description of the disease process and treatment options available to the cardiac patient.   Exercise & Equipment Safety: - Individual verbal instruction and demonstration of equipment use and safety with use of the equipment. Flowsheet Row Cardiac Rehab from 11/10/2016 in [facility] Cardiac and Pulmonary Rehab  Date  09/19/16  Educator  SB  Instruction Review Code  2- meets goals/outcomes      Infection Prevention: - Provides verbal and written material to individual with discussion of infection control including proper hand washing and proper equipment cleaning during exercise session. Flowsheet Row Cardiac Rehab from 11/10/2016 in [facility] Cardiac and Pulmonary Rehab  Date  09/19/16  Educator  SB  Instruction Review Code  2- meets goals/outcomes      Falls Prevention: - Provides verbal and written material to individual with discussion of falls prevention and safety.   Diabetes: - Individual verbal and written instruction to  review signs/symptoms of diabetes, desired ranges of glucose level fasting, after meals and with exercise. Advice that pre and post exercise glucose checks will be done for 3 sessions at entry of program.    Knowledge Questionnaire Score:     Knowledge Questionnaire Score - 09/19/16 1530      Knowledge Questionnaire Score   Pre Score 24/28      Core Components/Risk Factors/Patient Goals at Admission:     Personal Goals and Risk Factors at Admission - 09/19/16 1547      Core Components/Risk Factors/Patient Goals on Admission    Weight Management Obesity;Weight Loss;Yes   Intervention Weight Management: Develop a combined nutrition and exercise program designed to reach desired caloric intake, while maintaining  appropriate intake of nutrient and fiber, sodium and fats, and appropriate energy expenditure required for the weight goal.;Weight Management: Provide education and appropriate resources to help participant work on and attain dietary goals.;Weight Management/Obesity: Establish reasonable short term and long term weight goals.;Obesity: Provide education and appropriate resources to help participant work on and attain dietary goals.   Admit Weight 237 lb 4.8 oz (107.6 kg)   Goal Weight: Short Term 234 lb (106.1 kg)   Goal Weight: Long Term 180 lb (81.6 kg)   Expected Outcomes Short Term: Continue to assess and modify interventions until short term weight is achieved;Long Term: Adherence to nutrition and physical activity/exercise program aimed toward attainment of established weight goal;Weight Loss: Understanding of general recommendations for a balanced deficit meal plan, which promotes 1-2 lb weight loss per week and includes a negative energy balance of [phone] kcal/d   Sedentary Yes   Intervention Provide advice, education, support and counseling about physical activity/exercise needs.;Develop an individualized exercise prescription for aerobic and resistive training based on initial evaluation findings, risk stratification, comorbidities and participant's personal goals.   Expected Outcomes Achievement of increased cardiorespiratory fitness and enhanced flexibility, muscular endurance and strength shown through measurements of functional capacity and personal statement of participant.   Increase Strength and Stamina Yes   Intervention Provide advice, education, support and counseling about physical activity/exercise needs.;Develop an individualized exercise prescription for aerobic and resistive training based on initial evaluation findings, risk stratification, comorbidities and participant's personal goals.   Expected Outcomes Achievement of increased cardiorespiratory fitness and enhanced  flexibility, muscular endurance and strength shown through measurements of functional capacity and personal statement of participant.   Lipids Yes   Intervention Provide education and support for participant on nutrition & aerobic/resistive exercise along with prescribed medications to achieve LDL <8m, HDL >421m   Expected Outcomes Short Term: Participant states understanding of desired cholesterol values and is compliant with medications prescribed. Participant is following exercise prescription and nutrition guidelines.;Long Term: Cholesterol controlled with medications as prescribed, with individualized exercise RX and with personalized nutrition plan. Value goals: LDL < 7070mHDL > 40 mg.      Core Components/Risk Factors/Patient Goals Review:      Goals and Risk Factor Review    Row Name 10/20/16 1057 11/08/16 1041           Core Components/Risk Factors/Patient Goals Review   Personal Goals Review Weight Management/Obesity;Sedentary;Increase Strength and Stamina;Hypertension;Stress;Lipids Weight Management/Obesity;Sedentary;Increase Strength and Stamina;Hypertension;Stress;Lipids      Review HelKimethas been doing well with rehab.  She is feeling stronger.  Her weight has been steady as have her blood pressures. She has not had any issues with her medications.  Her stress was up some today after a friend was put into hospice. HelLillette Boxer  continues to do well with rehab.  She is improving at each sesssion. Her weight was up some today with extra fluid on board.  Her pressures have been good.  She had lots of blood work done last week at the [facility] but lipid panel has not come back and hepatic panel was slightly elevated.  Her biggest stressor now is just the holidays and the kids getting ready to get out of school.      Expected Outcomes Carolan will continue to come to rehab for education, exercise, and risk factor modification. Jeanifer is committed to rehab and works on her exercise and risk  factors.         Core Components/Risk Factors/Patient Goals at Discharge (Final Review):      Goals and Risk Factor Review - 11/08/16 1041      Core Components/Risk Factors/Patient Goals Review   Personal Goals Review Weight Management/Obesity;Sedentary;Increase Strength and Stamina;Hypertension;Stress;Lipids   Review Britlyn continues to do well with rehab.  She is improving at each sesssion. Her weight was up some today with extra fluid on board.  Her pressures have been good.  She had lots of blood work done last week at the [place] but lipid panel has not come back and hepatic panel was slightly elevated.  Her biggest stressor now is just the holidays and the kids getting ready to get out of school.   Expected Outcomes Mitsy is committed to rehab and works on her exercise and risk factors.      ITP Comments:     ITP Comments    Row Name 09/19/16 1540 09/28/16 0700 10/25/16 1037 10/26/16 0601 11/22/16 0623   ITP Comments Medical review completed. Intitial ITP created.  Documentation of Diagnosis can be found in [facility] Encounter 01/05/2016 30 day review completed for Medical Director physician review and signature. Continue ITP unless changes made by physician.New to program   Brandie will going to [facility] for a research study next week. 30 day review completed for review by Dr Emily Filbert.  Continue with ITP unless changes noted by Dr Sabra Heck. 30 day review. Continue with ITP unless directed changes per Medical Director review.      Comments:

## 2016-11-22 NOTE — Progress Notes (Signed)
Daily Session Note  Patient Details  Name: Brittany Newton MRN: [id number] Date of Birth: 1961/02/12 Referring Provider:   Flowsheet Row Cardiac Rehab from 09/19/2016 in [facility] Cardiac and Pulmonary Rehab  Referring Provider  Ward, Jeani Hawking MD      Encounter Date: 11/22/2016  Check In:     Session Check In - 11/22/16 0851      Check-In   Location [facility]-Cardiac & Pulmonary Rehab   Staff Present Alberteen Sam, MA, ACSM RCEP, Exercise Physiologist;Susanne Bice, RN, BSN, CCRP;Laureen Owens Shark, BS, RRT, Respiratory Therapist   Supervising physician immediately available to respond to emergencies See telemetry face sheet for immediately available ER MD   Medication changes reported     No   Warm-up and Cool-down Performed on first and last piece of equipment   Resistance Training Performed Yes   VAD Patient? No     Pain Assessment   Currently in Pain? No/denies   Multiple Pain Sites No         Goals Met:  Proper associated with RPD/PD & O2 Sat Independence with exercise equipment Exercise tolerated well No report of cardiac concerns or symptoms Strength training completed today  Goals Unmet:  Not Applicable  Comments: Pt able to follow exercise prescription today without complaint.  Will continue to monitor for progression.   Dr. Emily Filbert is Medical Director for [place] and [facility].

## 2016-11-29 ENCOUNTER — Encounter: Payer: Medicaid Other | Admitting: *Deleted

## 2016-11-29 DIAGNOSIS — Z952 Presence of prosthetic heart valve: Secondary | ICD-10-CM

## 2016-11-29 DIAGNOSIS — Z951 Presence of aortocoronary bypass graft: Secondary | ICD-10-CM | POA: Diagnosis not present

## 2016-11-29 DIAGNOSIS — Z9889 Other specified postprocedural states: Secondary | ICD-10-CM

## 2016-11-29 NOTE — Progress Notes (Signed)
Daily Session Note  Patient Details  Name: Brittany Newton MRN: [id number] Date of Birth: 1961/02/27 Referring Provider:   Flowsheet Row Cardiac Rehab from 09/19/2016 in [facility] Cardiac and Pulmonary Rehab  Referring Provider  Ward, Jeani Hawking MD      Encounter Date: 11/29/2016  Check In:     Session Check In - 11/29/16 0818      Check-In   Location [facility]-Cardiac & Pulmonary Rehab   Staff Present Alberteen Sam, MA, ACSM RCEP, Exercise Physiologist;Susanne Bice, RN, BSN, CCRP;Laureen Owens Shark, BS, RRT, Respiratory Therapist   Supervising physician immediately available to respond to emergencies See telemetry face sheet for immediately available ER MD   Medication changes reported     No   Warm-up and Cool-down Performed on first and last piece of equipment   Resistance Training Performed Yes   VAD Patient? No     Pain Assessment   Currently in Pain? No/denies   Multiple Pain Sites No         Goals Met:  Independence with exercise equipment Exercise tolerated well No report of cardiac concerns or symptoms Strength training completed today  Goals Unmet:  Not Applicable  Comments: Pt able to follow exercise prescription today without complaint.  Will continue to monitor for progression.   Dr. Emily Filbert is Medical Director for [place] and [facility].

## 2016-12-01 VITALS — Ht 73.0 in | Wt 238.0 lb

## 2016-12-01 DIAGNOSIS — Z951 Presence of aortocoronary bypass graft: Secondary | ICD-10-CM

## 2016-12-01 DIAGNOSIS — Z9889 Other specified postprocedural states: Secondary | ICD-10-CM

## 2016-12-01 DIAGNOSIS — Z952 Presence of prosthetic heart valve: Secondary | ICD-10-CM

## 2016-12-01 NOTE — Progress Notes (Signed)
Daily Session Note  Patient Details  Name: Brittany Newton MRN: [id number] Date of Birth: 1961-02-24 Referring Provider:   Flowsheet Row Cardiac Rehab from 09/19/2016 in [facility] Cardiac and Pulmonary Rehab  Referring Provider  Ward, Jeani Hawking MD      Encounter Date: 12/01/2016  Check In:     Session Check In - 12/01/16 0837      Check-In   Location [facility]-Cardiac & Pulmonary Rehab   Staff Present Alberteen Sam, MA, ACSM RCEP, Exercise Physiologist;Patricia Surles RN Vickki Hearing, BA, ACSM CEP, Exercise Physiologist   Supervising physician immediately available to respond to emergencies See telemetry face sheet for immediately available ER MD   Medication changes reported     No   Fall or balance concerns reported    No   Warm-up and Cool-down Performed on first and last piece of equipment   Resistance Training Performed Yes   VAD Patient? No     Pain Assessment   Currently in Pain? No/denies   Multiple Pain Sites No         Goals Met:  Independence with exercise equipment Exercise tolerated well No report of cardiac concerns or symptoms Strength training completed today  Goals Unmet:  Not Applicable  Comments: Pt able to follow exercise prescription today without complaint.  Will continue to monitor for progression.     [name] Name 09/19/16 1604 12/01/16 0946       6 Minute Walk   Phase Initial Discharge    Distance 1185 feet 1600 feet    Distance % Change  - 35 %  415 ft    Walk Time 6 minutes 6 minutes    # of Rest Breaks 0 0    MPH 2.24 3.03    METS 3.47 4.17    RPE 11 15    Perceived Dyspnea   - 4    VO2 Peak 12.13 14.59    Symptoms Yes (comment) Yes (comment)    Comments knees achy knees achy, SOB (sinuses)    Resting HR 67 bpm 65 bpm    Resting BP 136/72 110/56    Max Ex. HR 96 bpm 99 bpm    Max Ex. BP 138/70 132/76    2 Minute Post BP 132/66  -         Dr. Emily Filbert is Medical Director for [place] and  [facility].

## 2016-12-13 ENCOUNTER — Encounter: Payer: Medicaid Other | Admitting: Respiratory Therapy

## 2016-12-13 DIAGNOSIS — Z951 Presence of aortocoronary bypass graft: Secondary | ICD-10-CM | POA: Diagnosis not present

## 2016-12-13 DIAGNOSIS — Z9889 Other specified postprocedural states: Secondary | ICD-10-CM

## 2016-12-13 DIAGNOSIS — Z952 Presence of prosthetic heart valve: Secondary | ICD-10-CM

## 2016-12-13 NOTE — Progress Notes (Signed)
Daily Session Note  Patient Details  Name: Brittany Newton MRN: [id number] Date of Birth: Feb 25, 1961 Referring Provider:   Flowsheet Row Cardiac Rehab from 09/19/2016 in [facility] Cardiac and Pulmonary Rehab  Referring Provider  Ward, Jeani Hawking MD      Encounter Date: 12/13/2016  Check In:     Session Check In - 12/13/16 0945      Check-In   Location [facility]-Cardiac & Pulmonary Rehab   Staff Present Alberteen Sam, MA, ACSM RCEP, Exercise Physiologist;Susanne Bice, RN, BSN, CCRP;Laureen Owens Shark, BS, RRT, Respiratory Therapist   Supervising physician immediately available to respond to emergencies See telemetry face sheet for immediately available ER MD   Medication changes reported     No   Fall or balance concerns reported    No   Warm-up and Cool-down Performed on first and last piece of equipment   Resistance Training Performed Yes   VAD Patient? No     Pain Assessment   Currently in Pain? No/denies   Multiple Pain Sites No         Goals Met:  Independence with exercise equipment Exercise tolerated well No report of cardiac concerns or symptoms Strength training completed today  Goals Unmet:  Not Applicable  Comments: Pt able to follow exercise prescription today without complaint.  Will continue to monitor for progression.  [name] returned today after being out sick.  Her weight was down 6 lbs today.  Dr. Emily Filbert is Medical Director for [place] and [facility].

## 2016-12-15 ENCOUNTER — Encounter: Payer: Medicaid Other | Admitting: *Deleted

## 2016-12-15 DIAGNOSIS — Z952 Presence of prosthetic heart valve: Secondary | ICD-10-CM

## 2016-12-15 DIAGNOSIS — Z9889 Other specified postprocedural states: Secondary | ICD-10-CM

## 2016-12-15 DIAGNOSIS — Z951 Presence of aortocoronary bypass graft: Secondary | ICD-10-CM

## 2016-12-15 NOTE — Progress Notes (Signed)
Daily Session Note  Patient Details  Name: Brittany Newton MRN: [id number] Date of Birth: 11/18/1961 Referring Provider:   Flowsheet Row Cardiac Rehab from 09/19/2016 in [facility] Cardiac and Pulmonary Rehab  Referring Provider  Ward, Jeani Hawking MD      Encounter Date: 12/15/2016  Check In:     Session Check In - 12/15/16 0829      Check-In   Location [facility]-Cardiac & Pulmonary Rehab   Staff Present Alberteen Sam, MA, ACSM RCEP, Exercise Physiologist;Patricia Surles RN Vickki Hearing, BA, ACSM CEP, Exercise Physiologist   Supervising physician immediately available to respond to emergencies See telemetry face sheet for immediately available ER MD   Medication changes reported     No   Fall or balance concerns reported    No   Warm-up and Cool-down Performed on first and last piece of equipment   Resistance Training Performed Yes   VAD Patient? No     Pain Assessment   Currently in Pain? No/denies   Multiple Pain Sites No         Goals Met:  Independence with exercise equipment Exercise tolerated well No report of cardiac concerns or symptoms Strength training completed today  Goals Unmet:  Not Applicable  Comments: Pt able to follow exercise prescription today without complaint.  Will continue to monitor for progression.    Dr. Emily Filbert is Medical Director for [place] and [facility].

## 2016-12-15 NOTE — Patient Instructions (Signed)
Discharge Instructions  Patient Details  Name: Brittany Newton MRN: [id number] Date of Birth: 10-Oct-1961 Referring Provider:  Marcina Millard, MD   Number of Visits: 40  Reason for Discharge:  Patient reached a stable level of exercise. Patient independent in their exercise.  Smoking History:  History  Smoking Status  . Never Smoker  Smokeless Tobacco  . Never Used    Diagnosis:  S/P aortic valve replacement  S/P CABG x 1  S/P tricuspid valve repair  S/P mitral valve repair  Initial Exercise Prescription:     Initial Exercise Prescription - 09/19/16 1600      Date of Initial Exercise RX and Referring Provider   Date 09/19/16   Referring Provider Ward, Cary MD     Treadmill   MPH 1.8   Grade 0.5   Minutes 15   METs 2.5     NuStep   Level 2   Watts --  80-100 spm   Minutes 15   METs 2     Biostep-RELP   Level 2   Watts --  40-50 spm   Minutes 15   METs 2     Prescription Details   Frequency (times per week) 2   Duration Progress to 45 minutes of aerobic exercise without signs/symptoms of physical distress     Intensity   THRR 40-80% of Max Heartrate <100 bpm  Based on [employer] Exercise Guidelines   Ratings of Perceived Exertion 11-15   Perceived Dyspnea 0-4     Progression   Progression Continue to progress workloads to maintain intensity without signs/symptoms of physical distress.     Resistance Training   Training Prescription Yes   Weight 3 lbs   Reps 10-12      Discharge Exercise Prescription (Final Exercise Prescription Changes):     Exercise Prescription Changes - 12/13/16 1300      Exercise Review   Progression Yes     Response to Exercise   Blood Pressure (Admit) 100/60   Blood Pressure (Exercise) 128/66   Blood Pressure (Exit) 104/66   Heart Rate (Admit) 77 bpm   Heart Rate (Exercise) 85 bpm   Heart Rate (Exit) 74 bpm   Rating of Perceived Exertion (Exercise) 13   Symptoms none   Comments Home Exercise  Guidelines given 10/04/16   Duration Progress to 45 minutes of aerobic exercise without signs/symptoms of physical distress   Intensity THRR unchanged     Progression   Progression Continue to progress workloads to maintain intensity without signs/symptoms of physical distress.   Average METs 3.73     Resistance Training   Training Prescription Yes   Weight 3 lbs   Reps 10-12     Interval Training   Interval Training No     Treadmill   MPH 2.4   Grade 2   Minutes 15   METs 3.5     NuStep   Level 5   Minutes 15   METs 3.7     Biostep-RELP   Level 20   Minutes 15   METs 4     Home Exercise Plan   Plans to continue exercise at Home  walking at home and [facility]   Frequency Add 3 additional days to program exercise sessions.      Functional Capacity:     6 Minute Walk    Row Name 09/19/16 1604 12/01/16 0946       6 Minute Walk   Phase Initial Discharge    Distance 1185  feet 1600 feet    Distance % Change  - 35 %  415 ft    Walk Time 6 minutes 6 minutes    # of Rest Breaks 0 0    MPH 2.24 3.03    METS 3.47 4.17    RPE 11 15    Perceived Dyspnea   - 4    VO2 Peak 12.13 14.59    Symptoms Yes (comment) Yes (comment)    Comments knees achy knees achy, SOB (sinuses)    Resting HR 67 bpm 65 bpm    Resting BP 136/72 110/56    Max Ex. HR 96 bpm 99 bpm    Max Ex. BP 138/70 132/76    2 Minute Post BP 132/66  -       Quality of Life:     Quality of Life - 12/13/16 1100      Quality of Life Scores   Health/Function Pre 27 %   Health/Function Post 25.71 %   Health/Function % Change -4.78 %   Socioeconomic Pre 22.57 %   Socioeconomic Post 29.14 %   Socioeconomic % Change  29.11 %   Psych/Spiritual Pre 26.57 %   Psych/Spiritual Post 29.14 %   Psych/Spiritual % Change 9.67 %   Family Pre 30 %   Family Post 30 %   Family % Change 0 %   GLOBAL Pre 26.27 %   GLOBAL Post 27.75 %   GLOBAL % Change 5.63 %      Personal Goals: Goals established at  orientation with interventions provided to work toward goal.     Personal Goals and Risk Factors at Admission - 09/19/16 1547      Core Components/Risk Factors/Patient Goals on Admission    Weight Management Obesity;Weight Loss;Yes   Intervention Weight Management: Develop a combined nutrition and exercise program designed to reach desired caloric intake, while maintaining appropriate intake of nutrient and fiber, sodium and fats, and appropriate energy expenditure required for the weight goal.;Weight Management: Provide education and appropriate resources to help participant work on and attain dietary goals.;Weight Management/Obesity: Establish reasonable short term and long term weight goals.;Obesity: Provide education and appropriate resources to help participant work on and attain dietary goals.   Admit Weight 237 lb 4.8 oz (107.6 kg)   Goal Weight: Short Term 234 lb (106.1 kg)   Goal Weight: Long Term 180 lb (81.6 kg)   Expected Outcomes Short Term: Continue to assess and modify interventions until short term weight is achieved;Long Term: Adherence to nutrition and physical activity/exercise program aimed toward attainment of established weight goal;Weight Loss: Understanding of general recommendations for a balanced deficit meal plan, which promotes 1-2 lb weight loss per week and includes a negative energy balance of [phone] kcal/d   Sedentary Yes   Intervention Provide advice, education, support and counseling about physical activity/exercise needs.;Develop an individualized exercise prescription for aerobic and resistive training based on initial evaluation findings, risk stratification, comorbidities and participant's personal goals.   Expected Outcomes Achievement of increased cardiorespiratory fitness and enhanced flexibility, muscular endurance and strength shown through measurements of functional capacity and personal statement of participant.   Increase Strength and Stamina Yes    Intervention Provide advice, education, support and counseling about physical activity/exercise needs.;Develop an individualized exercise prescription for aerobic and resistive training based on initial evaluation findings, risk stratification, comorbidities and participant's personal goals.   Expected Outcomes Achievement of increased cardiorespiratory fitness and enhanced flexibility, muscular endurance and strength shown through measurements of  functional capacity and personal statement of participant.   Lipids Yes   Intervention Provide education and support for participant on nutrition & aerobic/resistive exercise along with prescribed medications to achieve LDL 70mg , HDL >40mg .   Expected Outcomes Short Term: Participant states understanding of desired cholesterol values and is compliant with medications prescribed. Participant is following exercise prescription and nutrition guidelines.;Long Term: Cholesterol controlled with medications as prescribed, with individualized exercise RX and with personalized nutrition plan. Value goals: LDL < 70mg , HDL > 40 mg.       Personal Goals Discharge:     Goals and Risk Factor Review - 11/08/16 1041      Core Components/Risk Factors/Patient Goals Review   Personal Goals Review Weight Management/Obesity;Sedentary;Increase Strength and Stamina;Hypertension;Stress;Lipids   Review Janann AugustHelene continues to do well with rehab.  She is improving at each sesssion. Her weight was up some today with extra fluid on board.  Her pressures have been good.  She had lots of blood work done last [facility] but lipid panel has not come back and hepatic panel was slightly elevated.  Her biggest stressor now is just the holidays and the kids getting ready to get out of school.   Expected Outcomes Janann AugustHelene is committed to rehab and works on her exercise and risk factors.      Nutrition & Weight - Outcomes:     Pre Biometrics - 09/19/16 1130      Pre Biometrics    Height 6\' 1"  (1.854 m)   Weight 237 lb 4.8 oz (107.6 kg)   Waist Circumference 35.5 inches   Hip Circumference 50 inches   Waist to Hip Ratio 0.71 %   BMI (Calculated) 31.4   Single Leg Stand 13.11 seconds         Post Biometrics - 12/01/16 0948       Post  Biometrics   Height 6\' 1"  (1.854 m)   Weight 238 lb (108 kg)   Waist Circumference 34 inches   Hip Circumference 49 inches   Waist to Hip Ratio 0.69 %   BMI (Calculated) 31.5   Single Leg Stand 12.96 seconds      Nutrition:     Nutrition Therapy & Goals - 09/19/16 1532      Intervention Plan   Intervention Prescribe, educate and counsel regarding individualized specific dietary modifications aiming towards targeted core components such as weight, hypertension, lipid management, diabetes, heart failure and other comorbidities.   Expected Outcomes Short Term Goal: Understand basic principles of dietary content, such as calories, fat, sodium, cholesterol and nutrients.;Short Term Goal: A plan has been developed with personal nutrition goals set during dietitian appointment.;Long Term Goal: Adherence to prescribed nutrition plan.      Nutrition Discharge:     Nutrition Assessments - 12/13/16 1059      Rate Your Plate Scores   Pre Score 54   Pre Score % 62.1 %   Post Score 68   Post Score % 75.6 %   % Change 13.5 %      Education Questionnaire Score:     Knowledge Questionnaire Score - 12/13/16 1059      Knowledge Questionnaire Score   Pre Score 24/28   Post Score 27/28      Goals reviewed with patient; copy given to patient.

## 2016-12-20 ENCOUNTER — Encounter: Payer: Medicaid Other | Admitting: Respiratory Therapy

## 2016-12-20 DIAGNOSIS — Z9889 Other specified postprocedural states: Secondary | ICD-10-CM

## 2016-12-20 DIAGNOSIS — Z951 Presence of aortocoronary bypass graft: Secondary | ICD-10-CM | POA: Diagnosis not present

## 2016-12-20 DIAGNOSIS — Z952 Presence of prosthetic heart valve: Secondary | ICD-10-CM

## 2016-12-20 NOTE — Progress Notes (Signed)
Discharge Summary  Patient Details  Name: Brittany AntiguaHelene Baez MRN: [id number] Date of Birth: [birth year] Referring Provider:   Flowsheet Row [facility]  Referring Provider  Ward, Cary MD       Number of Visits: 36  Reason for Discharge:  Patient reached a stable level of exercise. Patient independent in their exercise.  Smoking History:  History  Smoking Status  . Never Smoker  Smokeless Tobacco  . Never Used    Diagnosis:  S/P aortic valve replacement  S/P CABG x 1  S/P tricuspid valve repair  S/P mitral valve repair  ADL UCSD:   Initial Exercise Prescription:     Initial Exercise Prescription - 09/19/16 1600      Date of Initial Exercise RX and Referring Provider   Date 09/19/16   Referring Provider Ward, Cary MD     Treadmill   MPH 1.8   Grade 0.5   Minutes 15   METs 2.5     NuStep   Level 2   Watts --  80-100 spm   Minutes 15   METs 2     Biostep-RELP   Level 2   Watts --  40-50 spm   Minutes 15   METs 2     Prescription Details   Frequency (times per week) 2   Duration Progress to 45 minutes of aerobic exercise without signs/symptoms of physical distress     Intensity   THRR 40-80% of Max Heartrate <100 bpm  Based on [employer] Exercise Guidelines   Ratings of Perceived Exertion 11-15   Perceived Dyspnea 0-4     Progression   Progression Continue to progress workloads to maintain intensity without signs/symptoms of physical distress.     Resistance Training   Training Prescription Yes   Weight 3 lbs   Reps 10-12      Discharge Exercise Prescription (Final Exercise Prescription Changes):     Exercise Prescription Changes - 12/13/16 1300      Exercise Review   Progression Yes     Response to Exercise   Blood Pressure (Admit) 100/60   Blood Pressure (Exercise) 128/66   Blood Pressure (Exit) 104/66   Heart Rate (Admit) 77 bpm   Heart Rate (Exercise) 85 bpm   Heart Rate (Exit) 74 bpm   Rating of Perceived Exertion (Exercise) 13   Symptoms none   Comments Home Exercise Guidelines given 10/04/16   Duration Progress to 45 minutes of aerobic exercise without signs/symptoms of physical distress   Intensity THRR unchanged     Progression   Progression Continue to progress workloads to maintain intensity without signs/symptoms of physical distress.   Average METs 3.73     Resistance Training   Training Prescription Yes   Weight 3 lbs   Reps 10-12     Interval Training   Interval Training No     Treadmill   MPH 2.4   Grade 2   Minutes 15   METs 3.5     NuStep   Level 5   Minutes 15   METs 3.7     Biostep-RELP   Level 20   Minutes 15   METs 4     Home Exercise Plan   Plans to continue exercise at Home  walking at home and [facility]   Frequency Add 3 additional days to program exercise sessions.      Functional Capacity:     6 Minute Walk    Row  Name 09/19/16 1604 12/01/16 0946       6 Minute Walk   Phase Initial Discharge    Distance 1185 feet 1600 feet    Distance % Change  - 35 %  415 ft    Walk Time 6 minutes 6 minutes    # of Rest Breaks 0 0    MPH 2.24 3.03    METS 3.47 4.17    RPE 11 15    Perceived Dyspnea   - 4    VO2 Peak 12.13 14.59    Symptoms Yes (comment) Yes (comment)    Comments knees achy knees achy, SOB (sinuses)    Resting HR 67 bpm 65 bpm    Resting BP 136/72 110/56    Max Ex. HR 96 bpm 99 bpm    Max Ex. BP 138/70 132/76    2 Minute Post BP 132/66  -       Psychological, QOL, Others - Outcomes: PHQ 2/9: Depression screen [facility] 2/9 12/13/2016 09/19/2016  Decreased Interest 0 1  Down, Depressed, Hopeless 0 1  PHQ - 2 Score 0 2  Altered sleeping 1 3  Tired, decreased energy 0 3  Change in appetite 0 0  Feeling bad or failure about yourself  0 0  Trouble concentrating 0 0  Moving slowly or fidgety/restless 0 0  Suicidal thoughts 0 0  PHQ-9 Score 1 8  Difficult doing work/chores Not  difficult at all Not difficult at all    Quality of Life:     Quality of Life - 12/13/16 1100      Quality of Life Scores   Health/Function Pre 27 %   Health/Function Post 25.71 %   Health/Function % Change -4.78 %   Socioeconomic Pre 22.57 %   Socioeconomic Post 29.14 %   Socioeconomic % Change  29.11 %   Psych/Spiritual Pre 26.57 %   Psych/Spiritual Post 29.14 %   Psych/Spiritual % Change 9.67 %   Family Pre 30 %   Family Post 30 %   Family % Change 0 %   GLOBAL Pre 26.27 %   GLOBAL Post 27.75 %   GLOBAL % Change 5.63 %      Personal Goals: Goals established at orientation with interventions provided to work toward goal.     Personal Goals and Risk Factors at Admission - 09/19/16 1547      Core Components/Risk Factors/Patient Goals on Admission    Weight Management Obesity;Weight Loss;Yes   Intervention Weight Management: Develop a combined nutrition and exercise program designed to reach desired caloric intake, while maintaining appropriate intake of nutrient and fiber, sodium and fats, and appropriate energy expenditure required for the weight goal.;Weight Management: Provide education and appropriate resources to help participant work on and attain dietary goals.;Weight Management/Obesity: Establish reasonable short term and long term weight goals.;Obesity: Provide education and appropriate resources to help participant work on and attain dietary goals.   Admit Weight 237 lb 4.8 oz (107.6 kg)   Goal Weight: Short Term 234 lb (106.1 kg)   Goal Weight: Long Term 180 lb (81.6 kg)   Expected Outcomes Short Term: Continue to assess and modify interventions until short term weight is achieved;Long Term: Adherence to nutrition and physical activity/exercise program aimed toward attainment of established weight goal;Weight Loss: Understanding of general recommendations for a balanced deficit meal plan, which promotes 1-2 lb weight loss per week and includes a negative energy  balance of [phone] kcal/d   Sedentary Yes   Intervention  Provide advice, education, support and counseling about physical activity/exercise needs.;Develop an individualized exercise prescription for aerobic and resistive training based on initial evaluation findings, risk stratification, comorbidities and participant's personal goals.   Expected Outcomes Achievement of increased cardiorespiratory fitness and enhanced flexibility, muscular endurance and strength shown through measurements of functional capacity and personal statement of participant.   Increase Strength and Stamina Yes   Intervention Provide advice, education, support and counseling about physical activity/exercise needs.;Develop an individualized exercise prescription for aerobic and resistive training based on initial evaluation findings, risk stratification, comorbidities and participant's personal goals.   Expected Outcomes Achievement of increased cardiorespiratory fitness and enhanced flexibility, muscular endurance and strength shown through measurements of functional capacity and personal statement of participant.   Lipids Yes   Intervention Provide education and support for participant on nutrition & aerobic/resistive exercise along with prescribed medications to achieve LDL 70mg , HDL >40mg .   Expected Outcomes Short Term: Participant states understanding of desired cholesterol values and is compliant with medications prescribed. Participant is following exercise prescription and nutrition guidelines.;Long Term: Cholesterol controlled with medications as prescribed, with individualized exercise RX and with personalized nutrition plan. Value goals: LDL < 70mg , HDL > 40 mg.       Personal Goals Discharge:     Goals and Risk Factor Review    Row Name 10/20/16 1057 11/08/16 1041           Core Components/Risk Factors/Patient Goals Review   Personal Goals Review Weight Management/Obesity;Sedentary;Increase Strength and  Stamina;Hypertension;Stress;Lipids Weight Management/Obesity;Sedentary;Increase Strength and Stamina;Hypertension;Stress;Lipids      Review Carime has been doing well with rehab.  She is feeling stronger.  Her weight has been steady as have her blood pressures. She has not had any issues with her medications.  Her stress was up some today after a friend was put into hospice. Dede continues to do well with rehab.  She is improving at each sesssion. Her weight was up some today with extra fluid on board.  Her pressures have been good.  She had lots of blood work done last [facility] but lipid panel has not come back and hepatic panel was slightly elevated.  Her biggest stressor now is just the holidays and the kids getting ready to get out of school.      Expected Outcomes Ana will continue to come to rehab for education, exercise, and risk factor modification. Jaida is committed to rehab and works on her exercise and risk factors.         Nutrition & Weight - Outcomes:     Pre Biometrics - 09/19/16 1130      Pre Biometrics   Height 6\' 1"  (1.854 m)   Weight 237 lb 4.8 oz (107.6 kg)   Waist Circumference 35.5 inches   Hip Circumference 50 inches   Waist to Hip Ratio 0.71 %   BMI (Calculated) 31.4   Single Leg Stand 13.11 seconds         Post Biometrics - 12/01/16 0948       Post  Biometrics   Height 6\' 1"  (1.854 m)   Weight 238 lb (108 kg)   Waist Circumference 34 inches   Hip Circumference 49 inches   Waist to Hip Ratio 0.69 %   BMI (Calculated) 31.5   Single Leg Stand 12.96 seconds      Nutrition:     Nutrition Therapy & Goals - 09/19/16 1532      Intervention Plan   Intervention Prescribe, educate  and counsel regarding individualized specific dietary modifications aiming towards targeted core components such as weight, hypertension, lipid management, diabetes, heart failure and other comorbidities.   Expected Outcomes Short Term Goal: Understand basic principles  of dietary content, such as calories, fat, sodium, cholesterol and nutrients.;Short Term Goal: A plan has been developed with personal nutrition goals set during dietitian appointment.;Long Term Goal: Adherence to prescribed nutrition plan.      Nutrition Discharge:     Nutrition Assessments - 12/13/16 1059      Rate Your Plate Scores   Pre Score 54   Pre Score % 62.1 %   Post Score 68   Post Score % 75.6 %   % Change 13.5 %      Education Questionnaire Score:     Knowledge Questionnaire Score - 12/13/16 1059      Knowledge Questionnaire Score   Pre Score 24/28   Post Score 27/28      Goals reviewed with patient; copy given to patient.

## 2016-12-20 NOTE — Progress Notes (Signed)
Daily Session Note  Patient Details  Name: Brittany Newton MRN: [id number] Date of Birth: Sep 22, 1961 Referring Provider:   Flowsheet Row Cardiac Rehab from 09/19/2016 in [facility] Cardiac and Pulmonary Rehab  Referring Provider  Ward, Jeani Hawking MD      Encounter Date: 12/20/2016  Check In:     Session Check In - 12/20/16 0848      Check-In   Location [facility]-Cardiac & Pulmonary Rehab   Staff Present Alberteen Sam, MA, ACSM RCEP, Exercise Physiologist;Susanne Bice, RN, BSN, CCRP;Laureen Owens Shark, BS, RRT, Respiratory Therapist   Supervising physician immediately available to respond to emergencies See telemetry face sheet for immediately available ER MD   Fall or balance concerns reported    No   Warm-up and Cool-down Performed on first and last piece of equipment   Resistance Training Performed Yes   VAD Patient? No     Pain Assessment   Currently in Pain? No/denies   Multiple Pain Sites No         Goals Met:  Independence with exercise equipment Exercise tolerated well Personal goals reviewed No report of cardiac concerns or symptoms Strength training completed today  Goals Unmet:  Not Applicable  Comments: Pt able to follow exercise prescription today without complaint.  Will continue to monitor for progression.  Brittany Newton graduated today from cardiac rehab with 36 sessions completed.  Details of the patient's exercise prescription and what She needs to do in order to continue the prescription and progress were discussed with patient.  Patient was given a copy of prescription and goals.  Patient verbalized understanding.  [name] plans to continue to exercise by coming to the [employer] on Tuesdays and Thursdays.    Dr. Emily Filbert is Medical Director for [name].

## 2016-12-20 NOTE — Progress Notes (Signed)
Cardiac Individual Treatment Plan  Patient Details  Name: Brittany Newton MRN: [id number] Date of Birth: 1961/07/15 Referring Provider:   Flowsheet Row Cardiac Rehab from 09/19/2016 in [facility] Cardiac and Pulmonary Rehab  Referring Provider  Ward, Jeani Hawking MD      Initial Encounter Date:  Flowsheet Row Cardiac Rehab from 09/19/2016 in [facility] Cardiac and Pulmonary Rehab  Date  09/19/16  Referring Provider  Ward, Jeani Hawking MD      Visit Diagnosis: S/P aortic valve replacement  S/P CABG x 1  S/P tricuspid valve repair  S/P mitral valve repair  Patient's Home Medications on Admission:  Current Outpatient Prescriptions:  .  acetaminophen (TYLENOL) 500 MG tablet, Take 500 mg by mouth every 8 (eight) hours as needed for pain., Disp: , Rfl:  .  aspirin EC 81 MG tablet, Take 81 mg by mouth daily., Disp: , Rfl:  .  atorvastatin (LIPITOR) 40 MG tablet, Take 40 mg by mouth daily., Disp: , Rfl:  .  cholecalciferol (VITAMIN D) 1000 units tablet, Take 2,000 Units by mouth daily., Disp: , Rfl:  .  diclofenac sodium (VOLTAREN) 1 % GEL, Apply 2 g topically 2 (two) times daily as needed., Disp: , Rfl:  .  digoxin (LANOXIN) 0.25 MG tablet, Take 0.25 mg by mouth daily., Disp: , Rfl:  .  fexofenadine (ALLEGRA) 180 MG tablet, Take 180 mg by mouth every evening., Disp: , Rfl:  .  furosemide (LASIX) 40 MG tablet, Take 40 mg by mouth., Disp: , Rfl:  .  gabapentin (NEURONTIN) 400 MG capsule, Take 400 mg by mouth 3 (three) times daily., Disp: , Rfl:  .  HYDROmorphone (DILAUDID) 2 MG tablet, Take 2 mg by mouth 2 (two) times daily., Disp: , Rfl:  .  levalbuterol (XOPENEX HFA) 45 MCG/ACT inhaler, Inhale 2 puffs into the lungs every 4 (four) hours as needed for wheezing., Disp: , Rfl:  .  metoprolol (LOPRESSOR) 50 MG tablet, Take 50 mg by mouth 2 (two) times daily., Disp: , Rfl:  .  potassium chloride SA (K-DUR,KLOR-CON) 20 MEQ tablet, Take 20 mEq by mouth daily., Disp: , Rfl:  .  predniSONE (DELTASONE) 5 MG tablet, Take 5  mg by mouth daily with breakfast., Disp: , Rfl:  .  warfarin (COUMADIN) 5 MG tablet, Take 5 mg by mouth daily at 6 PM., Disp: , Rfl:   Past Medical History: Past Medical History:  Diagnosis Date  . Atrial fibrillation ([place])   . Cardiomegaly   . Loeys-Dietz syndrome    type 1  . Shingles   . Thyroid disease    hyperthyroidism    Tobacco Use: History  Smoking Status  . Never Smoker  Smokeless Tobacco  . Never Used    Labs: Recent Review Flowsheet Data    Labs for ITP Cardiac and Pulmonary Rehab Latest Ref Rng & Units 07/23/2011 07/28/2011 06/14/2013   Cholestrol 0 - 200 mg/dL - - 207(H)   LDLCALC 0 - 100 mg/dL - - 112(H)   HDL 40 - 60 mg/dL - - 78(H)   Trlycerides 0 - 200 mg/dL - - 87   PHART 7.350 - 7.400 - 7.468(H) -   PCO2ART 35.0 - 45.0 mmHg - 32.3(L) -   HCO3 20.0 - 24.0 mEq/L - 23.1 -   TCO2 0 - 100 mmol/L 19 20.3 -   O2SAT % - 96.1 -       Exercise Target Goals:    Exercise Program Goal: Individual exercise prescription set with THRR, safety & activity  barriers. Participant demonstrates ability to understand and report RPE using BORG scale, to self-measure pulse accurately, and to acknowledge the importance of the exercise prescription.  Exercise Prescription Goal: Starting with aerobic activity 30 plus minutes a day, 3 days per week for initial exercise prescription. Provide home exercise prescription and guidelines that participant acknowledges understanding prior to discharge.  Activity Barriers & Risk Stratification:     Activity Barriers & Cardiac Risk Stratification - 09/19/16 1536      Activity Barriers & Cardiac Risk Stratification   Activity Barriers Joint Problems;Deconditioning;Muscular Weakness  Has Loeys-Dietz syndrome which may inhibit some exercise levels, has bilateral knee pain   Cardiac Risk Stratification High      6 Minute Walk:     6 Minute Walk    Row Name 09/19/16 1604 12/01/16 0946       6 Minute Walk   Phase Initial  Discharge    Distance 1185 feet 1600 feet    Distance % Change  - 35 %  415 ft    Walk Time 6 minutes 6 minutes    # of Rest Breaks 0 0    MPH 2.24 3.03    METS 3.47 4.17    RPE 11 15    Perceived Dyspnea   - 4    VO2 Peak 12.13 14.59    Symptoms Yes (comment) Yes (comment)    Comments knees achy knees achy, SOB (sinuses)    Resting HR 67 bpm 65 bpm    Resting BP 136/72 110/56    Max Ex. HR 96 bpm 99 bpm    Max Ex. BP 138/70 132/76    2 Minute Post BP 132/66  -       Initial Exercise Prescription:     Initial Exercise Prescription - 09/19/16 1600      Date of Initial Exercise RX and Referring Provider   Date 09/19/16   Referring Provider Ward, Cary MD     Treadmill   MPH 1.8   Grade 0.5   Minutes 15   METs 2.5     NuStep   Level 2   Watts --  80-100 spm   Minutes 15   METs 2     Biostep-RELP   Level 2   Watts --  40-50 spm   Minutes 15   METs 2     Prescription Details   Frequency (times per week) 2   Duration Progress to 45 minutes of aerobic exercise without signs/symptoms of physical distress     Intensity   THRR 40-80% of Max Heartrate <100 bpm  Based on [employer] Exercise Guidelines   Ratings of Perceived Exertion 11-15   Perceived Dyspnea 0-4     Progression   Progression Continue to progress workloads to maintain intensity without signs/symptoms of physical distress.     Resistance Training   Training Prescription Yes   Weight 3 lbs   Reps 10-12      Perform Capillary Blood Glucose checks as needed.  Exercise Prescription Changes:     Exercise Prescription Changes    Row Name 09/19/16 1500 10/04/16 1000 10/04/16 1600 10/19/16 1500 11/01/16 1600     Exercise Review   Progression -  Walk Test Results  - Yes Yes Yes     Response to Exercise   Blood Pressure (Admit) 136/72  - 126/64 98/50 126/84   Blood Pressure (Exercise) 138/70  - 124/64 126/72 124/70   Blood Pressure (Exit) 132/66  - 100/62 126/62 100/68  Heart  Rate (Admit) 67 bpm  - 67 bpm 97 bpm 61 bpm   Heart Rate (Exercise) 96 bpm  - 101 bpm 101 bpm 93 bpm   Heart Rate (Exit) 69 bpm  - 84 bpm 85 bpm 87 bpm   Oxygen Saturation (Admit) 99 %  -  -  -  -   Oxygen Saturation (Exercise) 100 %  -  -  -  -   Rating of Perceived Exertion (Exercise) 11  - '11 15 14   ' Symptoms knees achy  - knee slips occasionally none none   Comments  - Home Exercise Guidelines given 10/04/16 Home Exercise Guidelines given 10/04/16 Home Exercise Guidelines given 10/04/16 Home Exercise Guidelines given 10/04/16   Duration  -  - Progress to 45 minutes of aerobic exercise without signs/symptoms of physical distress Progress to 45 minutes of aerobic exercise without signs/symptoms of physical distress Progress to 45 minutes of aerobic exercise without signs/symptoms of physical distress   Intensity  -  - THRR unchanged THRR unchanged THRR unchanged     Progression   Progression  -  - Continue to progress workloads to maintain intensity without signs/symptoms of physical distress. Continue to progress workloads to maintain intensity without signs/symptoms of physical distress. Continue to progress workloads to maintain intensity without signs/symptoms of physical distress.   Average METs  -  - 2.14 3 3.03     Resistance Training   Training Prescription  -  - Yes Yes Yes   Weight  -  - 3 lbs 3 lbs 3 lbs   Reps  -  - 10-12 10-12 10-12     Interval Training   Interval Training  -  - No No No     Treadmill   MPH  -  - 1.8 2.2 2.2   Grade  -  - 0.5 1.5 2   Minutes  -  - '15 15 15   ' METs  -  - 2.63 2.99 3.29     NuStep   Level  -  - '2 2 4   ' Minutes  -  - '15 15 15   ' METs  -  - 1.8 2 2.8     Biostep-RELP   Level  -  - '2 2 15   ' Minutes  -  - '15 15 15   ' METs  -  - '2 4 3     ' Home Exercise Plan   Plans to continue exercise at  - Home  walking at home and [facility]  walking at home and [facility]  walking at home and [facility]  walking at home and [facility]    Frequency  - Add 3 additional days to program exercise sessions. Add 3 additional days to program exercise sessions. Add 3 additional days to program exercise sessions. Add 3 additional days to program exercise sessions.   [place] Name 11/15/16 1500 11/29/16 1600 12/13/16 1300         Exercise Review   Progression Yes Yes Yes       Response to Exercise   Blood Pressure (Admit) 126/54 116/60 100/60     Blood Pressure (Exercise) 136/70 134/60 128/66     Blood Pressure (Exit) 126/72 122/60 104/66     Heart Rate (Admit) 89 bpm 68 bpm 77 bpm     Heart Rate (Exercise) 103 bpm 96 bpm 85 bpm     Heart Rate (Exit) 77 bpm 74 bpm 74 bpm     Rating  of Perceived Exertion (Exercise) '14 16 13     ' Symptoms none none none     Comments Home Exercise Guidelines given 10/04/16 Home Exercise Guidelines given 10/04/16 Home Exercise Guidelines given 10/04/16     Duration Progress to 45 minutes of aerobic exercise without signs/symptoms of physical distress Progress to 45 minutes of aerobic exercise without signs/symptoms of physical distress Progress to 45 minutes of aerobic exercise without signs/symptoms of physical distress     Intensity THRR unchanged THRR unchanged THRR unchanged       Progression   Progression Continue to progress workloads to maintain intensity without signs/symptoms of physical distress. Continue to progress workloads to maintain intensity without signs/symptoms of physical distress. Continue to progress workloads to maintain intensity without signs/symptoms of physical distress.     Average METs 3.2 3.7 3.73       Resistance Training   Training Prescription Yes Yes Yes     Weight 3 lbs 3 lbs 3 lbs     Reps 10-12 10-12 10-12       Interval Training   Interval Training No No No       Treadmill   MPH 2.3 2.4 2.4     Grade '2 2 2     ' Minutes '15 15 15     ' METs 3.4 3.5 3.5       NuStep   Level '5 5 5     ' Minutes '15 15 15     ' METs 3.1 3.7 3.7       Elliptical   Level  - 1  -      Speed  - 2  -     Minutes  - 15  -       Biostep-RELP   Level '15 15 20     ' Minutes '15 15 15     ' METs '3 4 4       ' Home Exercise Plan   Plans to continue exercise at Home  walking at home and [facility]  walking at home and [facility]  walking at home and [facility]     Frequency Add 3 additional days to program exercise sessions. Add 3 additional days to program exercise sessions. Add 3 additional days to program exercise sessions.        Exercise Comments:     Exercise Comments    Row Name 09/27/16 0825 10/04/16 1052 10/04/16 1601 10/07/16 0900 10/19/16 1523   Exercise Comments First full day of exercise!  Patient was oriented to gym and equipment including functions, settings, policies, and procedures.  Patient's individual exercise prescription and treatment plan were reviewed.  All starting workloads were established based on the results of the 6 minute walk test done at initial orientation visit.  The plan for exercise progression was also introduced and progression will be customized based on patient's performance and goals. Reviewed home exercise with pt today.  Pt plans to walk at home and at [facility] for exercise.  Reviewed THR, pulse, RPE, sign and symptoms, and when to call 911 or MD.  Also discussed weather considerations and indoor options.  Pt voiced understanding. Neville is off to a good start with rehab.  She is posting on boards about her exercise in her online support groups.  She is so excited!  We will continue to monitor her progression. I spoke with Lillette Boxer about getting good supportive shoes to help decrease pain from connective tissue disorder.  She is going to have someone analyze her gait  and help her find appropriate shoes. Marli is doing great in rehab.  She is starting to make some progress and stay under her THR.  She has looked into new shoes and is now just waiting approval for them to be purcharsed through the [facility].  We will continue to monitor  her progression.   [place] Name 10/20/16 1104 10/27/16 0913 11/01/16 1608 11/03/16 1042 11/15/16 1547   Exercise Comments Reviewed METs and progression today. Dorthia got her shoes yesterday that were ordered through the [facility].  She was able to increase workload on TM, reports lessened knee pain and better balance with the support form the shoes.  Ralphine is doing well in rehab.  Today, she is in [place] at [facility] for research.  She continues to make improvements.  We will continue to monitor her progression. Jo-Ann enjoyed her trip to the [place].  They provided her with some tools like support gloves, knee braces, aids for using weights, etc.  She also got approved to increase her THR to 120.  She is super excited to see what she can do. Tyjae is doing well in rehab.  He knee braces seem to be helping with her walking.  She has been pushing more since her heart rate increase.  She is now up to 3.2  METs.  We will continue to monitor her progression.   Row Name 11/29/16 1612 12/13/16 1358 12/20/16 1055       Exercise Comments Dejai continues to do well in rehab.  She tried out the ellipitcal today for the first time and liked the good workout.  We will continue to monitor her progression as she nears graduation. Laela has been doing well in rehab.  She was able to get the BioStep up to level 20 today.  She will be graduating next week.  We will continue to monitor her until then. Ivana graduated today from cardiac rehab with 36 sessions completed.  Details of the patient's exercise prescription and what She needs to do in order to continue the prescription and progress were discussed with patient.  Patient was given a copy of prescription and goals.  Patient verbalized understanding.  Bianka plans to continue to exercise by coming to the [employer] on Tuesdays and Thursdays.        Discharge Exercise Prescription (Final Exercise Prescription Changes):     Exercise Prescription Changes -  12/13/16 1300      Exercise Review   Progression Yes     Response to Exercise   Blood Pressure (Admit) 100/60   Blood Pressure (Exercise) 128/66   Blood Pressure (Exit) 104/66   Heart Rate (Admit) 77 bpm   Heart Rate (Exercise) 85 bpm   Heart Rate (Exit) 74 bpm   Rating of Perceived Exertion (Exercise) 13   Symptoms none   Comments Home Exercise Guidelines given 10/04/16   Duration Progress to 45 minutes of aerobic exercise without signs/symptoms of physical distress   Intensity THRR unchanged     Progression   Progression Continue to progress workloads to maintain intensity without signs/symptoms of physical distress.   Average METs 3.73     Resistance Training   Training Prescription Yes   Weight 3 lbs   Reps 10-12     Interval Training   Interval Training No     Treadmill   MPH 2.4   Grade 2   Minutes 15   METs 3.5     NuStep   Level 5  Minutes 15   METs 3.7     Biostep-RELP   Level 20   Minutes 15   METs 4     Home Exercise Plan   Plans to continue exercise at Home  walking at home and [facility]   Frequency Add 3 additional days to program exercise sessions.      Nutrition:  Target Goals: Understanding of nutrition guidelines, daily intake of sodium <1561m, cholesterol <2030m calories 30% from fat and 7% or less from saturated fats, daily to have 5 or more servings of fruits and vegetables.  Biometrics:     Pre Biometrics - 09/19/16 1130      Pre Biometrics   Height '6\' 1"'  (1.854 m)   Weight 237 lb 4.8 oz (107.6 kg)   Waist Circumference 35.5 inches   Hip Circumference 50 inches   Waist to Hip Ratio 0.71 %   BMI (Calculated) 31.4   Single Leg Stand 13.11 seconds         Post Biometrics - 12/01/16 0948       Post  Biometrics   Height '6\' 1"'  (1.854 m)   Weight 238 lb (108 kg)   Waist Circumference 34 inches   Hip Circumference 49 inches   Waist to Hip Ratio 0.69 %   BMI (Calculated) 31.5   Single Leg Stand 12.96 seconds       Nutrition Therapy Plan and Nutrition Goals:     Nutrition Therapy & Goals - 09/19/16 1532      Intervention Plan   Intervention Prescribe, educate and counsel regarding individualized specific dietary modifications aiming towards targeted core components such as weight, hypertension, lipid management, diabetes, heart failure and other comorbidities.   Expected Outcomes Short Term Goal: Understand basic principles of dietary content, such as calories, fat, sodium, cholesterol and nutrients.;Short Term Goal: A plan has been developed with personal nutrition goals set during dietitian appointment.;Long Term Goal: Adherence to prescribed nutrition plan.      Nutrition Discharge: Rate Your Plate Scores:     Nutrition Assessments - 12/13/16 1059      Rate Your Plate Scores   Pre Score 54   Pre Score % 62.1 %   Post Score 68   Post Score % 75.6 %   % Change 13.5 %      Nutrition Goals Re-Evaluation:     Nutrition Goals Re-Evaluation    Row Name 10/20/16 1100 11/08/16 1043           Weight   Current Weight 233 lb 14.4 oz (106.1 kg)  -        Intervention Plan   Intervention Nutrition handout(s) given to patient.  -      Comments Damiya declined an appointment with dietician.  She says the classes helped and she is making good decisions. HeTrianaontinues to make good nutrition decisions.  She stays focused on her diet         Psychosocial: Target Goals: Acknowledge presence or absence of depression, maximize coping skills, provide positive support system. Participant is able to verbalize types and ability to use techniques and skills needed for reducing stress and depression.  Initial Review & Psychosocial Screening:     Initial Psych Review & Screening - 09/19/16 1532      Initial Review   Current issues with Current Stress Concerns   Source of Stress Concerns Chronic Illness;Financial  Not working presently, plans to apply for disability   Comments Single mother  with 3 children ages 103,10  and 8 at home.  Mother lives with her too. One child with austism.     Family Dynamics   Good Support System? Yes  Mother ,daughter that live in [place]     Barriers   Psychosocial barriers to participate in program There are no identifiable barriers or psychosocial needs.;The patient should benefit from training in stress management and relaxation.     Screening Interventions   Interventions Encouraged to exercise      Quality of Life Scores:     Quality of Life - 12/13/16 1100      Quality of Life Scores   Health/Function Pre 27 %   Health/Function Post 25.71 %   Health/Function % Change -4.78 %   Socioeconomic Pre 22.57 %   Socioeconomic Post 29.14 %   Socioeconomic % Change  29.11 %   Psych/Spiritual Pre 26.57 %   Psych/Spiritual Post 29.14 %   Psych/Spiritual % Change 9.67 %   Family Pre 30 %   Family Post 30 %   Family % Change 0 %   GLOBAL Pre 26.27 %   GLOBAL Post 27.75 %   GLOBAL % Change 5.63 %      PHQ-9: Recent Review Flowsheet Data    Depression screen [facility] 2/9 12/13/2016 09/19/2016   Decreased Interest 0 1   Down, Depressed, Hopeless 0 1   PHQ - 2 Score 0 2   Altered sleeping 1 3   Tired, decreased energy 0 3    Change in appetite 0 0    Feeling bad or failure about yourself  0 0   Trouble concentrating 0 0   Moving slowly or fidgety/restless 0 0   Suicidal thoughts 0 0   PHQ-9 Score 1 8   Difficult doing work/chores Not difficult at all Not difficult at all      Psychosocial Evaluation and Intervention:     Psychosocial Evaluation - 11/15/16 0929      Psychosocial Evaluation & Interventions   Comments Counselor met with Ms. Sprague today for initial psychosocial evaluation.  She is a 56 year old who has been diagnosed with A-Fib and an earlier diagnosis of Loeys-Dietz Syndrome.  Ms. Lemmie Evens has an 65 year old mother who lives with her and is a huge help with the (3) special needs children that she has ages 47-11.  Ms. 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  Ms. Lemmie Evens has multiple health issues in addition to a chronic sleep problem. She has had a sleep study with no indication of sleep apnea.  Ms. Lemmie Evens states her sleep patterns are intermittent with approx. 2 hours awake in the middle of the night typically.  Her appetite is good and she denies a history of depression.  She does admit to a history of anxiety and is prescribed a low dose of Xanax to help with this.  Her stressors are obviously her children and her health issues, but she reports being typically positive and handling things well most of the time.   She has goals to breathe easier and increase her stamina while in this program.  Staff will continue to follow with Ms. Ruffino throughout the course of this program.        Psychosocial Re-Evaluation:     Psychosocial Re-Evaluation    Row Name 10/20/16 1101 11/08/16 1044 11/29/16 [phone]  Psychosocial Re-Evaluation   Interventions Encouraged to attend Cardiac Rehabilitation for the exercise;Stress management education Encouraged to attend Cardiac Rehabilitation for the exercise;Stress management education  -     Comments Cyrene is a single mother with three kids, including one with special needs.  She is doing the best she can by her kids, they come first.  She has had some increased stress with a friend being admitted suddenly to hospice in [place].  She is sleeping okay but listening to relaxation music to help her get back to sleep.  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 Marasia's biggest stressor now is getting through the holidays with the kids at home, but she feels that this is a good stress. Counselor follow up with Ms. H today reporting "survived the holidays."  She reports her mood is "okay" with more good days than bad at this point.  She is looking  into disability due to her diagnosis and the impact of this on her life; since it is a degenerative disease.  Counselor encouraged her to check with her Drs if questions about this or research the process and put it in motion now.          Vocational Rehabilitation: Provide vocational rehab assistance to qualifying candidates.   Vocational Rehab Evaluation & Intervention:     Vocational Rehab - 09/19/16 1530      Initial Vocational Rehab Evaluation & Intervention   Assessment shows need for Vocational Rehabilitation No      Education: Education Goals: Education classes will be provided on a weekly basis, covering required topics. Participant will state understanding/return demonstration of topics presented.  Learning Barriers/Preferences:     Learning Barriers/Preferences - 09/19/16 1538      Learning Barriers/Preferences   Learning Barriers None   Learning Preferences None      Education Topics: General Nutrition Guidelines/Fats and Fiber: -Group instruction provided by verbal, written material, models and posters to present the general guidelines for heart healthy nutrition. Gives an explanation and review of dietary fats and fiber. Flowsheet Row Cardiac Rehab from 12/20/2016 in [facility] Cardiac and Pulmonary Rehab  Date  11/29/16  Educator  PI  Instruction Review Code  2- meets goals/outcomes      Controlling Sodium/Reading Food Labels: -Group verbal and written material supporting the discussion of sodium use in heart healthy nutrition. Review and explanation with models, verbal and written materials for utilization of the food label. Flowsheet Row Cardiac Rehab from 12/20/2016 in [facility] Cardiac and Pulmonary Rehab  Date  10/11/16  Educator  PI  Instruction Review Code  2- meets goals/outcomes      Exercise Physiology & Risk Factors: - Group verbal and written instruction with models to review the exercise physiology of the cardiovascular system and associated critical  values. Details cardiovascular disease risk factors and the goals associated with each risk factor. Flowsheet Row Cardiac Rehab from 12/20/2016 in [facility] Cardiac and Pulmonary Rehab  Date  12/13/16  Educator  [facility]  Instruction Review Code  2- meets goals/outcomes      Aerobic Exercise & Resistance Training: - Gives group verbal and written discussion on the health impact of inactivity. On the components of aerobic and resistive training programs and the benefits of this training and how to safely progress through these programs. Flowsheet Row Cardiac Rehab from 12/20/2016 in [facility] [facility] Cardiac and Pulmonary Rehab  Date  12/15/16  Educator  AS  Instruction Review Code  2- meets goals/outcomes      Flexibility, Balance, General Exercise Guidelines: - Provides group verbal and written instruction on the benefits of flexibility and balance training programs. Provides general exercise guidelines with specific guidelines to those with heart or lung disease. Demonstration and skill practice provided. Flowsheet Row Cardiac Rehab from 12/20/2016 in [facility] Cardiac and Pulmonary Rehab  Date  12/20/16  Educator  [facility]  Instruction Review Code  2- meets goals/outcomes      Stress Management: - Provides group verbal and written instruction about the health risks of elevated stress, cause of high stress, and healthy ways to reduce stress. Flowsheet Row Cardiac Rehab from 12/20/2016 in [facility] Cardiac and Pulmonary Rehab  Date  11/03/16  Educator  TS  Instruction Review Code  2- meets goals/outcomes      Depression: - Provides group verbal and written instruction on the correlation between heart/lung disease and depressed mood, treatment options, and the stigmas associated with seeking treatment. Flowsheet Row Cardiac Rehab from 12/20/2016 in [facility] Cardiac and Pulmonary Rehab  Date  12/01/16  Educator  TS  Instruction Review Code  2- meets goals/outcomes      Anatomy & Physiology of the Heart: - Group verbal  and written instruction and models provide basic cardiac anatomy and physiology, with the coronary electrical and arterial systems. Review of: AMI, Angina, Valve disease, Heart Failure, Cardiac Arrhythmia, Pacemakers, and the ICD. Flowsheet Row Cardiac Rehab from 12/20/2016 in [facility] Cardiac and Pulmonary Rehab  Date  10/25/16  Educator  SB  Instruction Review Code  2- meets goals/outcomes      Cardiac Procedures: - Group verbal and written instruction and models to describe the testing methods done to diagnose heart disease. Reviews the outcomes of the test results. Describes the treatment choices: Medical Management, Angioplasty, or Coronary Bypass Surgery.   Cardiac Medications: - Group verbal and written instruction to review commonly prescribed medications for heart disease. Reviews the medication, class of the drug, and side effects. Includes the steps to properly store meds and maintain the prescription regimen. Flowsheet Row Cardiac Rehab from 12/20/2016 in [facility] Cardiac and Pulmonary Rehab  Date  11/10/16 Marisue Humble 2]  Educator  TS  Instruction Review Code  2- meets goals/outcomes      Go Sex-Intimacy & Heart Disease, Get SMART - Goal Setting: - Group verbal and written instruction through game format to discuss heart disease and the return to sexual intimacy. Provides group verbal and written material to discuss and apply goal setting through the application of the S.M.A.R.T. Method.   Other Matters of the Heart: - Provides group verbal, written materials and models to describe Heart Failure, Angina, Valve Disease, and Diabetes in the realm of heart disease. Includes description of the disease process and treatment options available to the cardiac patient.   Exercise & Equipment Safety: - Individual verbal instruction and demonstration of equipment use and safety with use of the equipment. Flowsheet Row Cardiac Rehab from 12/20/2016 in [facility] Cardiac and Pulmonary Rehab  Date  09/19/16   Educator  SB  Instruction Review Code  2- meets goals/outcomes      Infection Prevention: - Provides verbal and written material to individual with discussion of infection control including proper hand washing and proper equipment cleaning during exercise session. Flowsheet Row Cardiac Rehab from 12/20/2016 in [facility] Cardiac and Pulmonary Rehab  Date  09/19/16  Educator  SB  Instruction Review Code  2- meets goals/outcomes      Falls Prevention: - Provides verbal and  written material to individual with discussion of falls prevention and safety.   Diabetes: - Individual verbal and written instruction to review signs/symptoms of diabetes, desired ranges of glucose level fasting, after meals and with exercise. Advice that pre and post exercise glucose checks will be done for 3 sessions at entry of program.    Knowledge Questionnaire Score:     Knowledge Questionnaire Score - 12/13/16 1059      Knowledge Questionnaire Score   Pre Score 24/28   Post Score 27/28      Core Components/Risk Factors/Patient Goals at Admission:     Personal Goals and Risk Factors at Admission - 09/19/16 1547      Core Components/Risk Factors/Patient Goals on Admission    Weight Management Obesity;Weight Loss;Yes   Intervention Weight Management: Develop a combined nutrition and exercise program designed to reach desired caloric intake, while maintaining appropriate intake of nutrient and fiber, sodium and fats, and appropriate energy expenditure required for the weight goal.;Weight Management: Provide education and appropriate resources to help participant work on and attain dietary goals.;Weight Management/Obesity: Establish reasonable short term and long term weight goals.;Obesity: Provide education and appropriate resources to help participant work on and attain dietary goals.   Admit Weight 237 lb 4.8 oz (107.6 kg)   Goal Weight: Short Term 234 lb (106.1 kg)   Goal Weight: Long Term 180 lb (81.6  kg)   Expected Outcomes Short Term: Continue to assess and modify interventions until short term weight is achieved;Long Term: Adherence to nutrition and physical activity/exercise program aimed toward attainment of established weight goal;Weight Loss: Understanding of general recommendations for a balanced deficit meal plan, which promotes 1-2 lb weight loss per week and includes a negative energy balance of [phone] kcal/d   Sedentary Yes   Intervention Provide advice, education, support and counseling about physical activity/exercise needs.;Develop an individualized exercise prescription for aerobic and resistive training based on initial evaluation findings, risk stratification, comorbidities and participant's personal goals.   Expected Outcomes Achievement of increased cardiorespiratory fitness and enhanced flexibility, muscular endurance and strength shown through measurements of functional capacity and personal statement of participant.   Increase Strength and Stamina Yes   Intervention Provide advice, education, support and counseling about physical activity/exercise needs.;Develop an individualized exercise prescription for aerobic and resistive training based on initial evaluation findings, risk stratification, comorbidities and participant's personal goals.   Expected Outcomes Achievement of increased cardiorespiratory fitness and enhanced flexibility, muscular endurance and strength shown through measurements of functional capacity and personal statement of participant.   Lipids Yes   Intervention Provide education and support for participant on nutrition & aerobic/resistive exercise along with prescribed medications to achieve LDL <65m, HDL >438m   Expected Outcomes Short Term: Participant states understanding of desired cholesterol values and is compliant with medications prescribed. Participant is following exercise prescription and nutrition guidelines.;Long Term: Cholesterol controlled  with medications as prescribed, with individualized exercise RX and with personalized nutrition plan. Value goals: LDL < 7025mHDL > 40 mg.      Core Components/Risk Factors/Patient Goals Review:      Goals and Risk Factor Review    Row Name 10/20/16 1057 11/08/16 1041           Core Components/Risk Factors/Patient Goals Review   Personal Goals Review Weight Management/Obesity;Sedentary;Increase Strength and Stamina;Hypertension;Stress;Lipids Weight Management/Obesity;Sedentary;Increase Strength and Stamina;Hypertension;Stress;Lipids      Review HelKennettes been doing well with rehab.  She is feeling stronger.  Her weight has been steady as have her  blood pressures. She has not had any issues with her medications.  Her stress was up some today after a friend was put into hospice. Neeva continues to do well with rehab.  She is improving at each sesssion. Her weight was up some today with extra fluid on board.  Her pressures have been good.  She had lots of blood work done last week at the [employer] but lipid panel has not come back and hepatic panel was slightly elevated.  Her biggest stressor now is just the holidays and the kids getting ready to get out of school.      Expected Outcomes Jacora will continue to come to rehab for education, exercise, and risk factor modification. Drina is committed to rehab and works on her exercise and risk factors.         Core Components/Risk Factors/Patient Goals at Discharge (Final Review):      Goals and Risk Factor Review - 11/08/16 1041      Core Components/Risk Factors/Patient Goals Review   Personal Goals Review Weight Management/Obesity;Sedentary;Increase Strength and Stamina;Hypertension;Stress;Lipids   Review Aiesha continues to do well with rehab.  She is improving at each sesssion. Her weight was up some today with extra fluid on board.  Her pressures have been good.  She had lots of blood work done last week at the [place] but lipid panel has not  come back and hepatic panel was slightly elevated.  Her biggest stressor now is just the holidays and the kids getting ready to get out of school.   Expected Outcomes Arlisa is committed to rehab and works on her exercise and risk factors.      ITP Comments:     ITP Comments    Row Name 09/19/16 1540 09/28/16 0700 10/25/16 1037 10/26/16 0601 11/22/16 0623   ITP Comments Medical review completed. Intitial ITP created.  Documentation of Diagnosis can be found in [facility] Encounter 01/05/2016 30 day review completed for Medical Director physician review and signature. Continue ITP unless changes made by physician.New to program   [place] will going to [facility] for a research study next week. 30 day review completed for review by Dr Emily Filbert.  Continue with ITP unless changes noted by Dr Sabra Heck. 30 day review. Continue with ITP unless directed changes per Medical Director review.      Comments: Discharge ITP

## 2017-03-29 ENCOUNTER — Ambulatory Visit
Admission: RE | Admit: 2017-03-29 | Discharge: 2017-03-29 | Disposition: A | Discharge status: Home / Self Care | Payer: Medicaid Other | Source: Ambulatory Visit | Attending: Dermatology | Admitting: Dermatology

## 2017-03-29 ENCOUNTER — Other Ambulatory Visit: Payer: Self-pay | Admitting: Dermatology

## 2017-03-29 DIAGNOSIS — L608 Other nail disorders: Secondary | ICD-10-CM

## 2018-02-16 IMAGING — CR DG CHEST 2V
1 series · 2 of 2 positions shown · non-contrast
Comparison: 09/23/2013

CLINICAL DATA: Left leg swelling.

EXAM:
CHEST  2 VIEW

[Series 1: dg chest 2 view · 0.14mm/px · 2 of 2 slices shown]
[im 1/2]
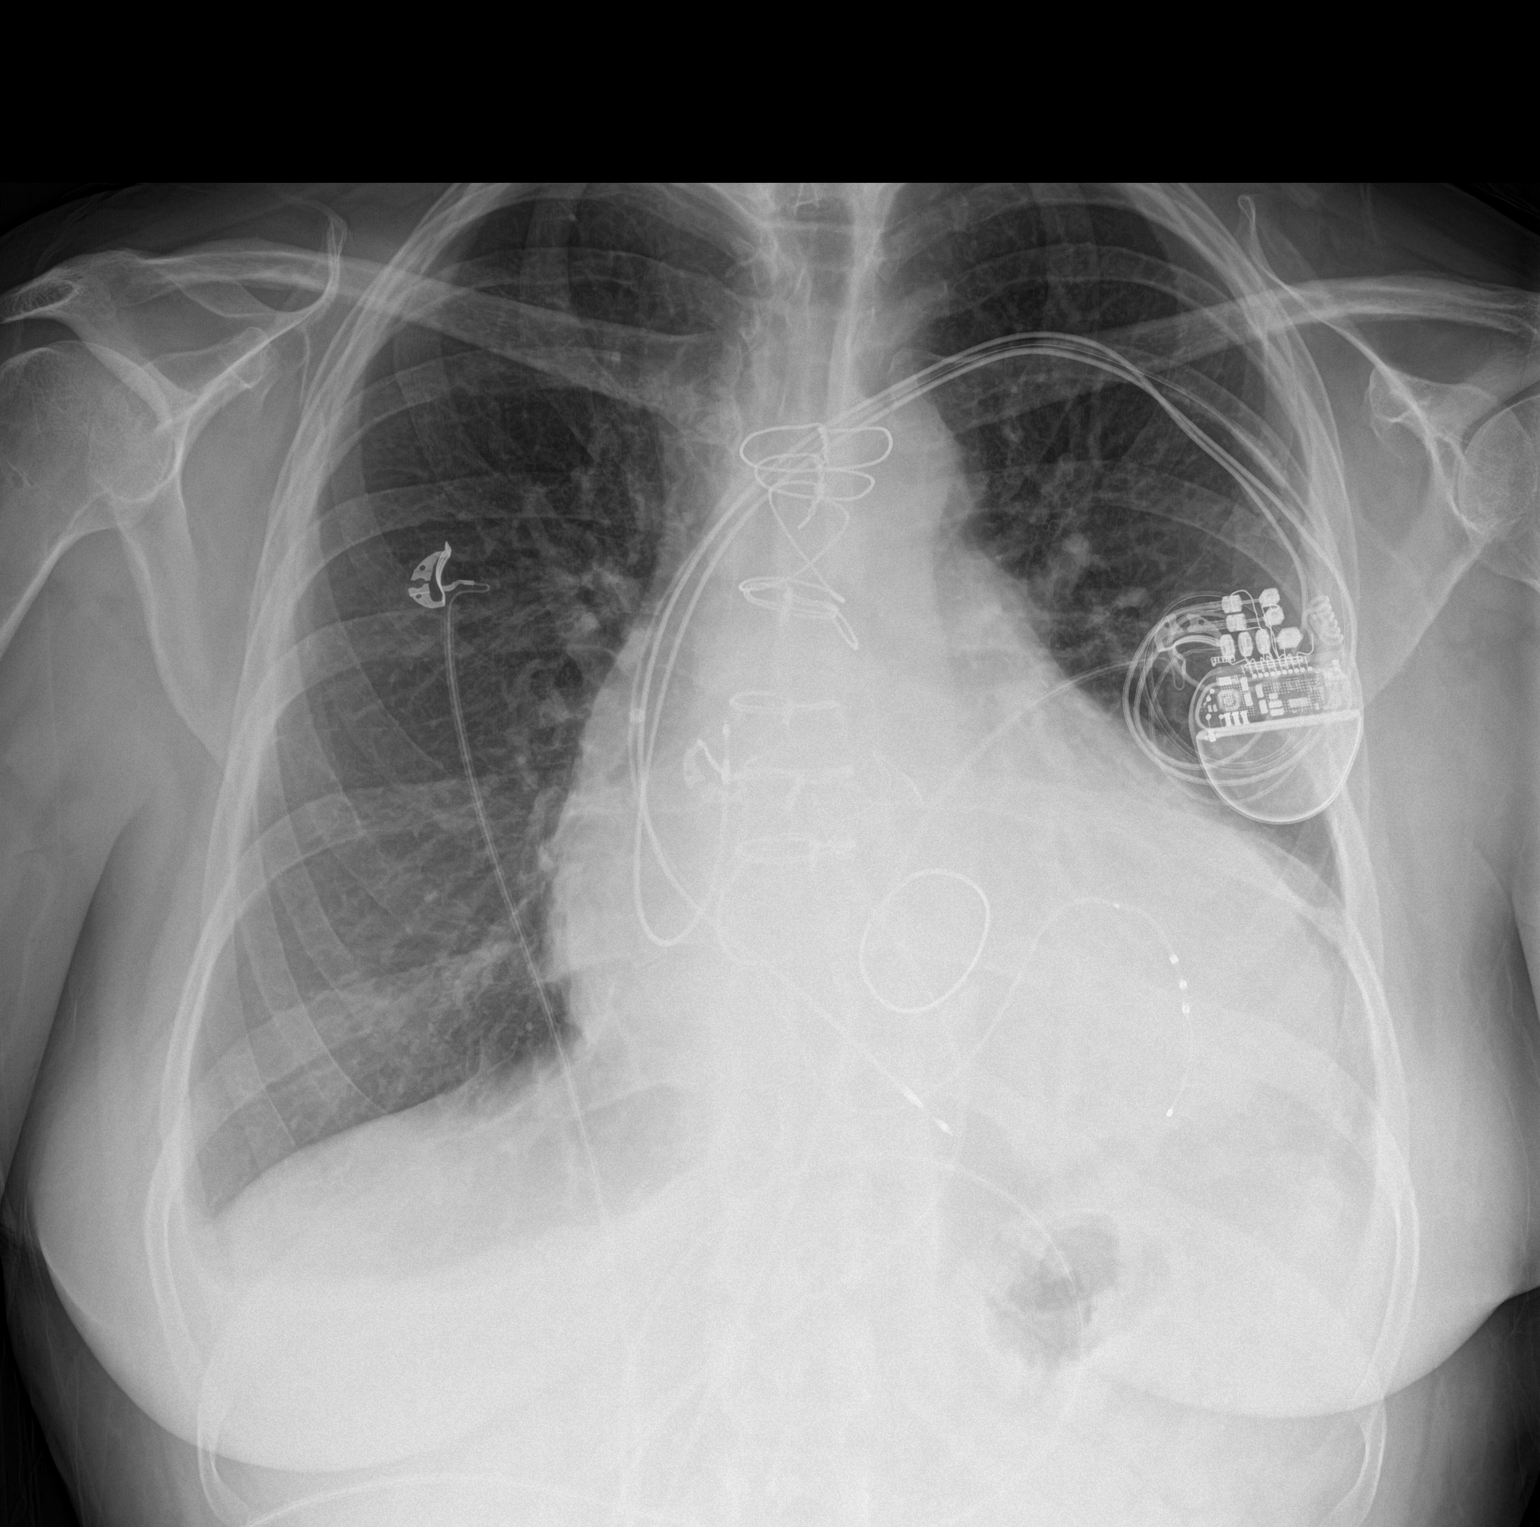
[im 2/2]
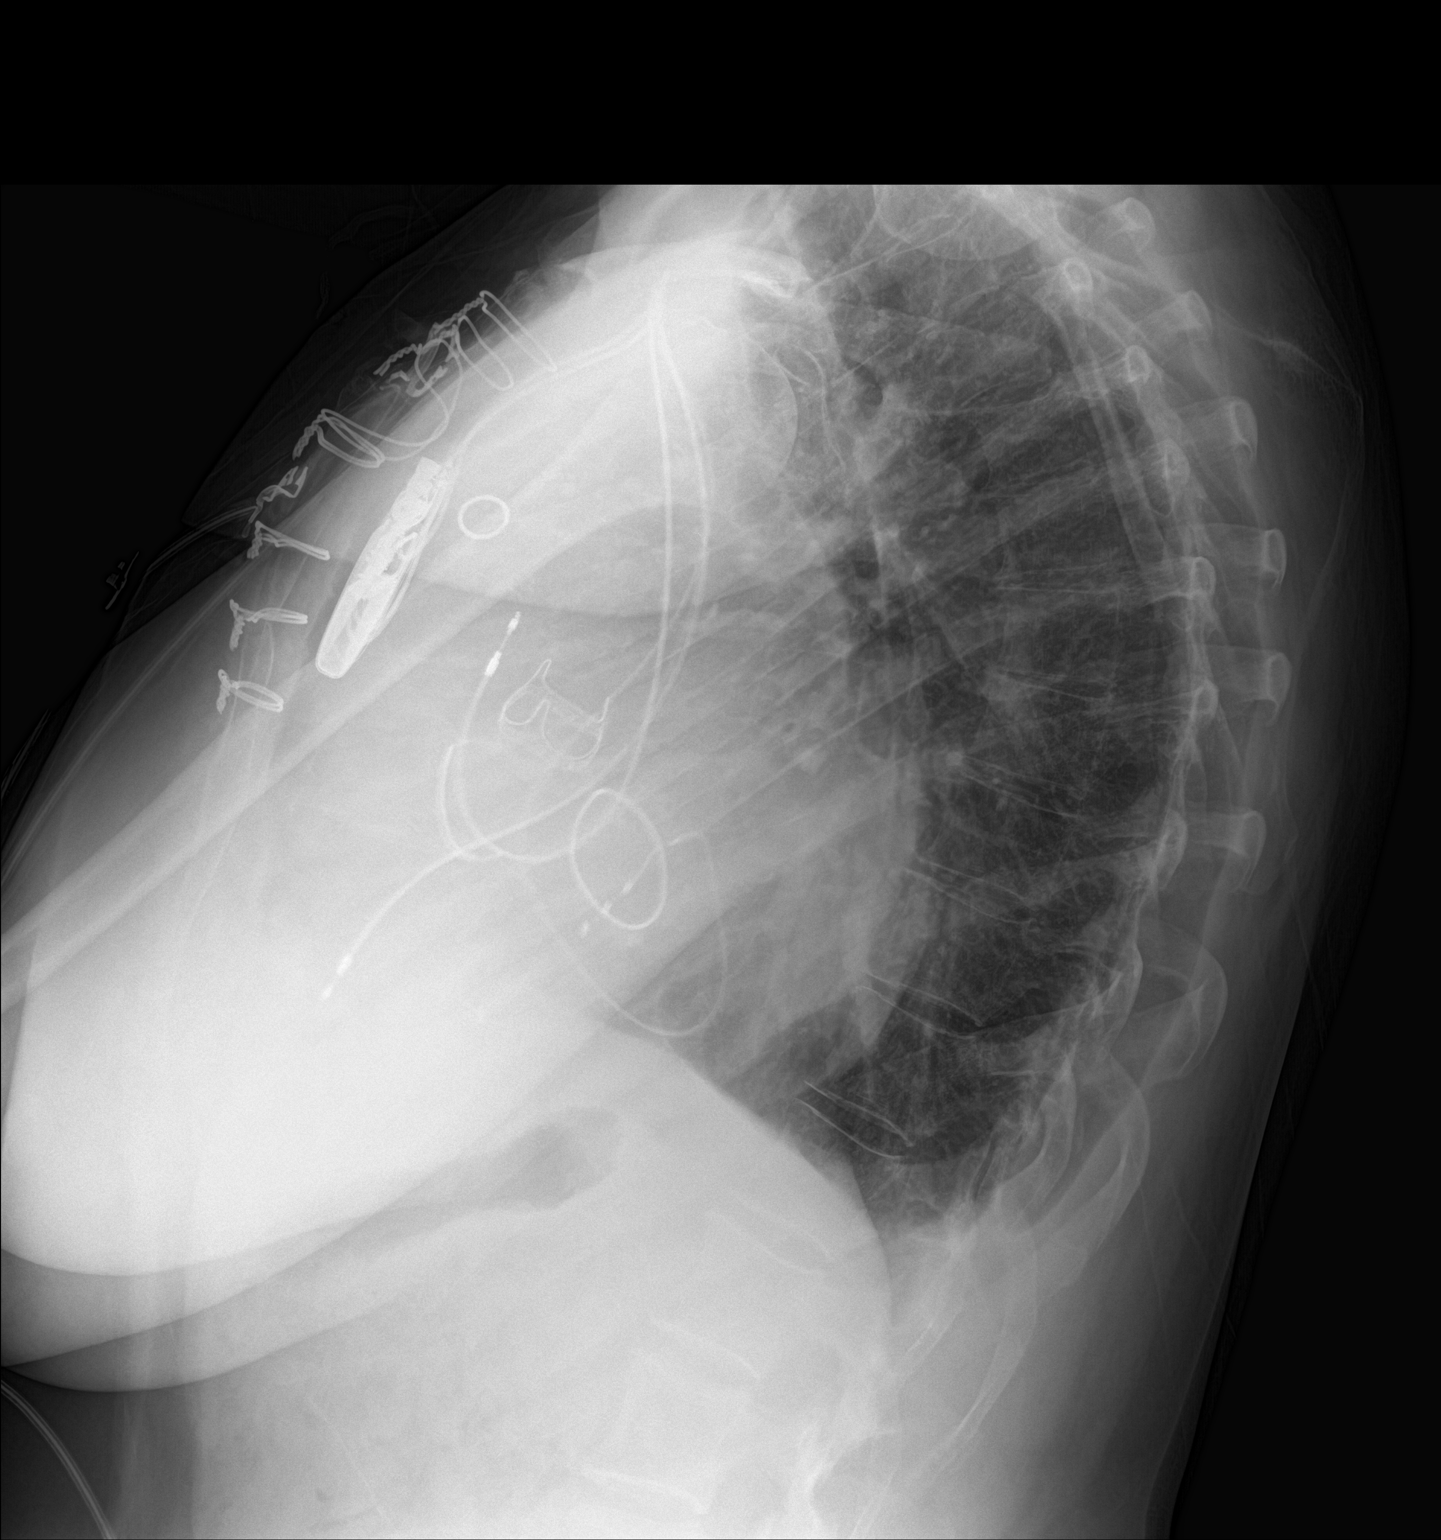

[2 of 2 positions shown; findings below may reference images not displayed]

FINDINGS: Cardiac enlargement which has progressed in the interval. Interval
mitral valve and aortic valve replacement and pacemaker placement.

Negative for heart failure. No edema. Small pleural effusions. Mild
left lower lobe atelectasis related to cardiac enlargement.
IMPRESSION: Cardiac enlargement has progressed in the interval with interval
mitral valve and aortic valve replacement. Interval placement of
pacemaker.

Small bilateral effusions and left lower lobe atelectasis. Negative
for edema.

## 2020-05-21 DEATH — deceased
